# Patient Record
Sex: Male | Born: 1965 | Race: White | Hispanic: No | Smoking: Never smoker
Health system: Southern US, Community
[De-identification: ages and names within clinical notes are randomized; demographics above are authoritative.]

## PROBLEM LIST (undated history)

## (undated) DIAGNOSIS — M1990 Unspecified osteoarthritis, unspecified site: Secondary | ICD-10-CM

## (undated) DIAGNOSIS — F32A Depression, unspecified: Secondary | ICD-10-CM

## (undated) DIAGNOSIS — E119 Type 2 diabetes mellitus without complications: Secondary | ICD-10-CM

## (undated) DIAGNOSIS — G709 Myoneural disorder, unspecified: Secondary | ICD-10-CM

## (undated) DIAGNOSIS — R0600 Dyspnea, unspecified: Secondary | ICD-10-CM

## (undated) HISTORY — PX: LEG SURGERY: SHX1003

---

## 2018-12-18 ENCOUNTER — Other Ambulatory Visit: Payer: Self-pay

## 2018-12-18 ENCOUNTER — Encounter (HOSPITAL_BASED_OUTPATIENT_CLINIC_OR_DEPARTMENT_OTHER): Payer: Self-pay | Admitting: *Deleted

## 2018-12-18 ENCOUNTER — Emergency Department (HOSPITAL_BASED_OUTPATIENT_CLINIC_OR_DEPARTMENT_OTHER)
Admission: EM | Admit: 2018-12-18 | Discharge: 2018-12-18 | Disposition: A | Discharge status: Home / Self Care | Payer: Self-pay | Attending: Emergency Medicine | Admitting: Emergency Medicine

## 2018-12-18 DIAGNOSIS — L03116 Cellulitis of left lower limb: Secondary | ICD-10-CM | POA: Insufficient documentation

## 2018-12-18 DIAGNOSIS — L0390 Cellulitis, unspecified: Secondary | ICD-10-CM

## 2018-12-18 DIAGNOSIS — M25562 Pain in left knee: Secondary | ICD-10-CM

## 2018-12-18 MED ORDER — CLINDAMYCIN HCL 300 MG PO CAPS: 300.0000 mg | ORAL_CAPSULE | Freq: Three times a day (TID) | ORAL | 0 refills | Status: AC

## 2018-12-18 NOTE — ED Provider Notes (Signed)
[facility] Provider Note   CSN: [id number] Arrival date & time: 12/18/18  2205     History   Chief Complaint Chief Complaint  Patient presents with   Knee Pain    HPI Tommy Scott is a 53 y.o. male.     The history is provided by the patient.  Knee Pain Location:  Knee Time since incident:  2 days Knee location:  R knee and L knee Pain details:    Quality:  Aching   Radiates to:  Does not radiate   Severity:  Mild   Onset quality:  Gradual   Duration:  2 days   Timing:  Constant   Progression:  Worsening Chronicity:  New Relieved by:  Nothing Worsened by:  Bearing weight Associated symptoms: swelling   Associated symptoms: no back pain, no decreased ROM, no fatigue, no fever, no itching, no muscle weakness, no neck pain, no numbness, no stiffness and no tingling     History reviewed. No pertinent past medical history.  There are no active problems to display for this patient.   History reviewed. No pertinent surgical history.      Home Medications    Prior to Admission medications   Medication Sig Start Date End Date Taking? Authorizing Provider  clindamycin (CLEOCIN) 300 MG capsule Take 1 capsule (300 mg total) by mouth 3 (three) times daily for 10 days. 12/18/18 12/28/18  Lennice Sites, DO    Family History No family history on file.  Social History Social History   Tobacco Use   Smoking status: Never Smoker   Smokeless tobacco: Never Used  Substance Use Topics   Alcohol use: Not Currently   Drug use: Never     Allergies   Sulfamethoxazole-trimethoprim   Review of Systems Review of Systems  Constitutional: Negative for chills, fatigue and fever.  HENT: Negative for ear pain and sore throat.   Eyes: Negative for pain and visual disturbance.  Respiratory: Negative for cough and shortness of breath.   Cardiovascular: Negative for chest pain and palpitations.  Gastrointestinal: Negative for abdominal  pain and vomiting.  Genitourinary: Negative for dysuria and hematuria.  Musculoskeletal: Positive for arthralgias. Negative for back pain, neck pain and stiffness.  Skin: Positive for color change, rash and wound. Negative for itching.  Neurological: Negative for seizures and syncope.  All other systems reviewed and are negative.    Physical Exam Updated Vital Signs BP 106/66    Pulse 90    Temp 98.4 F (36.9 C) (Oral)    Resp 20    Ht 5\' 10"  (1.778 m)    Wt 83.9 kg    SpO2 98%    BMI 26.54 kg/m   Physical Exam Vitals signs and nursing note reviewed.  Constitutional:      General: He is not in acute distress.    Appearance: He is well-developed. He is not ill-appearing.  HENT:     Head: Normocephalic and atraumatic.     Nose: Nose normal.     Mouth/Throat:     Mouth: Mucous membranes are moist.  Eyes:     Extraocular Movements: Extraocular movements intact.     Conjunctiva/sclera: Conjunctivae normal.  Neck:     Musculoskeletal: Normal range of motion and neck supple.  Cardiovascular:     Rate and Rhythm: Normal rate and regular rhythm.     Pulses: Normal pulses.     Heart sounds: Normal heart sounds. No murmur.  Pulmonary:  Effort: Pulmonary effort is normal. No respiratory distress.     Breath sounds: Normal breath sounds.  Abdominal:     Palpations: Abdomen is soft.     Tenderness: There is no abdominal tenderness.  Musculoskeletal: Normal range of motion.        General: Swelling present. No tenderness.     Right lower leg: No edema.     Left lower leg: No edema.     Comments: Normal range of motion without pain to the left knee, there is some swelling to the medial portion of the left knee but  pinpoint tenderness  Skin:    General: Skin is warm and dry.     Comments: 2 scabs to the left knee with surrounding redness but no fluctuance  Neurological:     General: No focal deficit present.     Mental Status: He is alert.     Sensory: No sensory deficit.      Motor: No weakness.     Comments: 5+ out of 5 strength throughout left lower extremity, normal sensation      ED Treatments / Results  Labs (all labs ordered are listed, but only abnormal results are displayed) Labs Reviewed - No data to display  EKG None  Radiology No results found.  Procedures Procedures (including critical care time)  Medications Ordered in ED Medications - No data to display   Initial Impression / Assessment and Plan / ED Course  I have reviewed the triage vital signs and the nursing notes.  Pertinent labs & imaging results that were available during my care of the patient were reviewed by me and considered in my medical decision making (see chart for details).     Tommy Scott is a 53 year old male with history of diabetes who presents to the ED with redness around the left knee area.  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  He has 2 areas of skin breakdown around the inner part of the left knee with surrounding cellulitis.  There does not appear to be any joint involvement.  Does not have pain with range of motion of the left knee.  No obvious major joint swelling on exam.  Has had skin infections in the past especially in the lower portion of this leg.  However this cellulitis appears fairly localized.  This is likely from traumatic/pressure injury from being on his knees for work.  Given education about needing to change his position when he is on work.  He needs to not be on his knees for prolonged periods of time especially now that it looks like he has an infection.  Will prescribe clindamycin.  Given education about wound care instructions.  No concern for abscess or deeper space infection at this time.  However was given strict return precautions.  Recommend wound check at around 48 to 72 hours or sooner if redness rapidly expands.  However, this appears to have occurred over the last several days and is likely slowly progressing.   There may be some element of bursitis as well but at this point mostly think that this is cellulitic in nature.  Was given information to follow-up with sports medicine as well if symptoms do not improve as likely some component could be a traumatic bursitis.  Recommend follow-up with primary care doctor and patient understands return precautions.  This chart was dictated using voice recognition software.  Despite best efforts to proofread,  errors can occur which can  change the documentation meaning.    Final Clinical Impressions(s) / ED Diagnoses   Final diagnoses:  Cellulitis, unspecified cellulitis site  Acute pain of left knee    ED Discharge Orders         Ordered    clindamycin (CLEOCIN) 300 MG capsule  3 times daily     12/18/18 2253           Virgina Norfolk, DO 12/19/18 0002

## 2018-12-18 NOTE — ED Triage Notes (Signed)
Left knee pain. States his knee is red, hot, painful and swollen. He feels like he has fluid accumulated on his knee.

## 2018-12-18 NOTE — Discharge Instructions (Addendum)
Take antibiotic as prescribed.  If redness gets worse over the next 48 to 72 hours after antibiotics please return for wound recheck.  Ice your knee as discussed.  Keep elevated as well.  Please try to avoid any trauma to your left knee.

## 2019-06-05 ENCOUNTER — Other Ambulatory Visit: Payer: Self-pay

## 2019-06-05 ENCOUNTER — Encounter (HOSPITAL_BASED_OUTPATIENT_CLINIC_OR_DEPARTMENT_OTHER): Payer: Self-pay

## 2019-06-05 ENCOUNTER — Emergency Department (HOSPITAL_BASED_OUTPATIENT_CLINIC_OR_DEPARTMENT_OTHER)
Admission: EM | Admit: 2019-06-05 | Discharge: 2019-06-06 | Disposition: A | Discharge status: Home / Self Care | Payer: Self-pay | Attending: Emergency Medicine | Admitting: Emergency Medicine

## 2019-06-05 DIAGNOSIS — E119 Type 2 diabetes mellitus without complications: Secondary | ICD-10-CM | POA: Insufficient documentation

## 2019-06-05 DIAGNOSIS — Z23 Encounter for immunization: Secondary | ICD-10-CM | POA: Insufficient documentation

## 2019-06-05 DIAGNOSIS — L03115 Cellulitis of right lower limb: Secondary | ICD-10-CM

## 2019-06-05 DIAGNOSIS — Z882 Allergy status to sulfonamides status: Secondary | ICD-10-CM | POA: Insufficient documentation

## 2019-06-05 DIAGNOSIS — L02415 Cutaneous abscess of right lower limb: Secondary | ICD-10-CM | POA: Insufficient documentation

## 2019-06-05 HISTORY — DX: Type 2 diabetes mellitus without complications: E11.9

## 2019-06-05 MED ORDER — LIDOCAINE-EPINEPHRINE (PF) 2 %-1:200000 IJ SOLN
20.0000 mL | Freq: Once | INTRAMUSCULAR | Status: AC
  Administered 2019-06-06: 20 mL
  Filled 2019-06-05: qty 20

## 2019-06-05 NOTE — ED Notes (Signed)
Provider aware medications, I & D tray + betadine are at bedside.

## 2019-06-05 NOTE — ED Triage Notes (Signed)
Pt states he a break in the skin injury 1.5 weeks ago to right LE-pt with swelling, redness drainage x 1 week-NAD-steady gait

## 2019-06-05 NOTE — ED Provider Notes (Signed)
[facility] EM[facility]ENCY DEPARTMENT Provider Note  CSN: [id number] Arrival date & time: 06/05/19 2118  Chief Complaint(s) Leg Swelling  HPI Tommy Scott is a 54 y.o. male  With h/oDM here with right leg injury. Hit leg on tail hitch 1.5 weeks ago. Started having local swelling a few days later, which worsened several days ago. Pain throbbing and worsened. Worse with palpation. No alleviating factors.  Started draining tonight. Noted surrounding redness yesterday.  No fevers or chills, N/V. No other physical complaints.  HPI  Past Medical History Past Medical History:  Diagnosis Date  . Diabetes mellitus without complication (HCC)    There are no problems to display for this patient.  Home Medication(s) Prior to Admission medications   Medication Sig Start Date End Date Taking? Authorizing Provider  clindamycin (CLEOCIN) 150 MG capsule Take 3 capsules (450 mg total) by mouth 3 (three) times daily for 7 days. 06/06/19 06/13/19  Nira Conn, MD                                                                                                                                    Past Surgical History Past Surgical History:  Procedure Laterality Date  . LEG SURGERY     Family History No family history on file.  Social History Social History   Tobacco Use  . Smoking status: Never Smoker  . Smokeless tobacco: Never Used  Substance Use Topics  . Alcohol use: Not Currently  . Drug use: Never   Allergies Sulfa antibiotics and Sulfamethoxazole-trimethoprim  Review of Systems Review of Systems All other systems are reviewed and are negative for acute change except as noted in the HPI  Physical Exam Vital Signs  I have reviewed the triage vital signs BP 128/74 (BP Location: Left Arm)   Pulse 89   Temp 99.2 F (37.3 C) (Oral)   Resp 18   SpO2 99%   Physical Exam Vitals reviewed.  Constitutional:      General: He is not in acute distress.  Appearance: He is well-developed. He is not diaphoretic.  HENT:     Head: Normocephalic and atraumatic.     Jaw: No trismus.     Right Ear: External ear normal.     Left Ear: External ear normal.     Nose: Nose normal.  Eyes:     General: No scleral icterus.    Conjunctiva/sclera: Conjunctivae normal.  Neck:     Trachea: Phonation normal.  Cardiovascular:     Rate and Rhythm: Normal rate and regular rhythm.  Pulmonary:     Effort: Pulmonary effort is normal. No respiratory distress.     Breath sounds: No stridor.  Abdominal:     General: There is no distension.  Musculoskeletal:        General: Normal range of motion.     Cervical back: Normal range of motion.     Right lower leg:  Swelling and tenderness present.       Legs:  Neurological:     Mental Status: He is alert and oriented to person, place, and time.  Psychiatric:        Behavior: Behavior normal.     ED Results and Treatments Labs (all labs ordered are listed, but only abnormal results are displayed) Labs Reviewed - No data to display                                                                                                                       EKG  EKG Interpretation  Date/Time:    Ventricular Rate:    PR Interval:    QRS Duration:   QT Interval:    QTC Calculation:   R Axis:     Text Interpretation:        Radiology No results found.  Pertinent labs & imaging results that were available during my care of the patient were reviewed by me and considered in my medical decision making (see chart for details).  Medications Ordered in ED Medications  lidocaine-EPINEPHrine (XYLOCAINE W/EPI) 2 %-1:200000 (PF) injection 20 mL (20 mLs Infiltration Given 06/06/19 0056)  Tdap (BOOSTRIX) injection 0.5 mL (0.5 mLs Intramuscular Given 06/06/19 0056)  clindamycin (CLEOCIN) capsule 450 mg (450 mg Oral Given 06/06/19 0056)                                                                                                                                     Procedures .Marland KitchenIncision and Drainage  Date/Time: 06/06/2019 1:13 AM Performed by: Fatima Blank, MD Authorized by: Fatima Blank, MD   Consent:    Consent obtained:  Verbal   Consent given by:  Patient   Risks discussed:  Bleeding, incomplete drainage, infection and pain   Alternatives discussed:  Delayed treatment Location:    Type:  Abscess   Size:  4 cm   Location:  Lower extremity   Lower extremity location:  Leg   Leg location:  R lower leg Pre-procedure details:    Skin preparation:  Betadine Anesthesia (see MAR for exact dosages):    Anesthesia method:  Local infiltration   Local anesthetic:  Lidocaine 2% WITH epi Procedure type:    Complexity:  Complex Procedure details:    Incision types:  Cruciate   Incision depth:  Subcutaneous   Scalpel blade:  11   Wound management:  Probed and deloculated and extensive cleaning   Drainage:  Bloody and purulent   Drainage amount:  Moderate   Wound treatment:  Wound left open   Packing materials:  None Post-procedure details:    Patient tolerance of procedure:  Tolerated well, no immediate complications    (including critical care time)  Medical Decision Making / ED Course I have reviewed the nursing notes for this encounter and the patient's prior records (if available in EHR or on provided paperwork).   Tommy Scott was evaluated in Emergency Department on 06/06/2019 for the symptoms described in the history of present illness. He was evaluated in the context of the global COVID-19 pandemic, which necessitated consideration that the patient might be at risk for infection with the SARS-CoV-2 virus that causes COVID-19. Institutional protocols and algorithms that pertain to the evaluation of patients at risk for COVID-19 are in a state of rapid change based on information released by regulatory bodies including the CDC and federal and state organizations. These  policies and algorithms were followed during the patient's care in the ED.  No [place] available. Appears to be infected hematoma with surrounding cellulitis. I&D as above. Tetanus updated. 1st dose of clinda given here. Will have return in 2-3 days for wound check.     Final Clinical Impression(s) / ED Diagnoses Final diagnoses:  Abscess of right leg  Cellulitis of right lower extremity   The patient appears reasonably screened and/or stabilized for discharge and I doubt any other medical condition or other [facility] requiring further screening, evaluation, or treatment in the ED at this time prior to discharge. Safe for discharge with strict return precautions.  Disposition: Discharge  Condition: Good  I have discussed the results, Dx and Tx plan with the patient/family who expressed understanding and agree(s) with the plan. Discharge instructions discussed at length. The patient/family was given strict return precautions who verbalized understanding of the instructions. No further questions at time of discharge.    ED Discharge Orders         Ordered    clindamycin (CLEOCIN) 150 MG capsule  3 times daily     06/06/19 0123           Follow Up: [facility] [place] [facility] [address] [id number] mc [place] [address] [phone] Go to  in 2-3 days, For wound re-check      This chart was dictated using voice recognition software.  Despite best efforts to proofread,  errors can occur which can change the documentation meaning.   Nira Conn, MD 06/06/19 [phone]

## 2019-06-06 MED ORDER — TETANUS-DIPHTH-ACELL PERTUSSIS 5-2.5-18.5 LF-MCG/0.5 IM SUSP
0.5000 mL | Freq: Once | INTRAMUSCULAR | Status: AC
  Administered 2019-06-06: 0.5 mL via INTRAMUSCULAR
  Filled 2019-06-06: qty 0.5

## 2019-06-06 MED ORDER — CLINDAMYCIN HCL 150 MG PO CAPS
450.0000 mg | ORAL_CAPSULE | Freq: Once | ORAL | Status: AC
  Administered 2019-06-06: 01:00:00 450 mg via ORAL
  Filled 2019-06-06: qty 3

## 2019-06-06 MED ORDER — CLINDAMYCIN HCL 150 MG PO CAPS: 450.0000 mg | ORAL_CAPSULE | Freq: Three times a day (TID) | ORAL | 0 refills | Status: AC

## 2019-06-08 ENCOUNTER — Other Ambulatory Visit: Payer: Self-pay

## 2019-06-08 ENCOUNTER — Emergency Department (HOSPITAL_BASED_OUTPATIENT_CLINIC_OR_DEPARTMENT_OTHER): Payer: Self-pay

## 2019-06-08 ENCOUNTER — Emergency Department (HOSPITAL_BASED_OUTPATIENT_CLINIC_OR_DEPARTMENT_OTHER)
Admission: EM | Admit: 2019-06-08 | Discharge: 2019-06-08 | Disposition: A | Discharge status: Home / Self Care | Payer: Self-pay | Attending: Emergency Medicine | Admitting: Emergency Medicine

## 2019-06-08 ENCOUNTER — Encounter (HOSPITAL_BASED_OUTPATIENT_CLINIC_OR_DEPARTMENT_OTHER): Payer: Self-pay | Admitting: *Deleted

## 2019-06-08 DIAGNOSIS — L02415 Cutaneous abscess of right lower limb: Secondary | ICD-10-CM | POA: Insufficient documentation

## 2019-06-08 DIAGNOSIS — E119 Type 2 diabetes mellitus without complications: Secondary | ICD-10-CM | POA: Insufficient documentation

## 2019-06-08 DIAGNOSIS — Z5189 Encounter for other specified aftercare: Secondary | ICD-10-CM

## 2019-06-08 MED ORDER — LIDOCAINE-EPINEPHRINE (PF) 2 %-1:200000 IJ SOLN
10.0000 mL | Freq: Once | INTRAMUSCULAR | Status: AC
  Administered 2019-06-08: 10 mL
  Filled 2019-06-08: qty 10

## 2019-06-08 NOTE — ED Notes (Signed)
ED Provider at bedside. 

## 2019-06-08 NOTE — ED Triage Notes (Addendum)
Pt had I&D to right leg on Wed. Here for re-check. He states he has been taking clindamycin TID

## 2019-06-08 NOTE — ED Provider Notes (Signed)
[facility] Provider Note   CSN: [id number] Arrival date & time: 06/08/19  2149     History Chief Complaint  Patient presents with  . Wound Check    Tommy Scott is a 54 y.o. male presenting to ED for wound recheck.  He had an abscess drained overlying his right mid-tibia 4 days ago in our ER.  He was started on clindamycin and has been taking it, halfway through his 10 day course.  He reports the redness and swelling around his leg has significant improved.  He denies active drainage from the wound.  He returned for a wound check.  He denies fevers or chills.  HPI     Past Medical History:  Diagnosis Date  . Diabetes mellitus without complication (HCC)     There are no problems to display for this patient.   Past Surgical History:  Procedure Laterality Date  . LEG SURGERY         No family history on file.  Social History   Tobacco Use  . Smoking status: Never Smoker  . Smokeless tobacco: Never Used  Substance Use Topics  . Alcohol use: Not Currently  . Drug use: Never    Home Medications Prior to Admission medications   Medication Sig Start Date End Date Taking? Authorizing Provider  clindamycin (CLEOCIN) 150 MG capsule Take 3 capsules (450 mg total) by mouth 3 (three) times daily for 7 days. 06/06/19 06/13/19 Yes Cardama, Amadeo Garnet, MD    Allergies    Sulfa antibiotics and Sulfamethoxazole-trimethoprim  Review of Systems   Review of Systems  Constitutional: Negative for chills and fever.  Gastrointestinal: Negative for abdominal pain and vomiting.  Genitourinary: Negative for dysuria and hematuria.  Musculoskeletal: Positive for arthralgias and myalgias. Negative for back pain.  Skin: Positive for rash and wound.  Neurological: Negative for syncope.  All other systems reviewed and are negative.   Physical Exam Updated Vital Signs BP (!) 147/81 (BP Location: Right Arm)   Pulse 87   Temp 98.3 F (36.8 C) (Oral)    Resp 16   Ht 5\' 10"  (1.778 m)   Wt 90.3 kg   SpO2 96%   BMI 28.55 kg/m   Physical Exam Vitals and nursing note reviewed.  Constitutional:      Appearance: He is well-developed.  HENT:     Head: Normocephalic and atraumatic.  Cardiovascular:     Rate and Rhythm: Normal rate and regular rhythm.     Pulses: Normal pulses.  Pulmonary:     Effort: Pulmonary effort is normal. No respiratory distress.  Musculoskeletal:     Cervical back: Neck supple.  Skin:    General: Skin is warm and dry.     Comments: Right tibial wound site continues to have surrounding erythema without significant warmth, erythema appears to have improved from prior visit, no active drainage from wound, small hematoma overlying anterior tibia near incision site without fluctuance   Neurological:     General: No focal deficit present.     Mental Status: He is alert.     Sensory: No sensory deficit.         ED Results / Procedures / Treatments   Labs (all labs ordered are listed, but only abnormal results are displayed) Labs Reviewed - No data to display  EKG None  Radiology DG Tibia/Fibula Right  Result Date: 06/08/2019 CLINICAL DATA:  Abscess, assess for radiopaque foreign body EXAM: RIGHT TIBIA AND FIBULA - 2 VIEW  COMPARISON:  None. FINDINGS: Frontal and lateral views of the right tibia and fibula are obtained. There is prominent soft tissue edema anterior aspect mid right lower leg. Multiple calcifications are seen within the superficial soft tissues overlying the anterior tibia. No radiopaque foreign bodies. There are no acute or destructive bony lesions. Right knee and ankle are well aligned. IMPRESSION: 1. Soft tissue edema, most pronounced over the anterior mid tibial diaphysis. 2. Soft tissue calcifications.  No other radiopaque foreign bodies. Electronically Signed   By: Sharlet Salina M.D.   On: 06/08/2019 22:43    Procedures Ultrasound ED Soft Tissue  Date/Time: 06/08/2019 11:18  PM Performed by: Terald Sleeper, MD Authorized by: Terald Sleeper, MD   Procedure details:    Indications: localization of abscess     Transverse view:  Visualized   Longitudinal view:  Visualized   Images: archived   Location:    Location: lower extremity     Side:  Right Comments:     Small fluid collection visualized underneath incision site Surrounding soft tissue swelling Small circular hyperechoic structure visualized in middle of former drainage site .Marland KitchenIncision and Drainage  Date/Time: 06/08/2019 11:19 PM Performed by: Terald Sleeper, MD Authorized by: Terald Sleeper, MD   Consent:    Consent obtained:  Verbal   Consent given by:  Patient   Risks discussed:  Bleeding Location:    Type:  Abscess   Size:  2   Location:  Lower extremity   Lower extremity location:  Leg   Leg location:  R lower leg Pre-procedure details:    Skin preparation:  Antiseptic wash Anesthesia (see MAR for exact dosages):    Anesthesia method:  Local infiltration   Local anesthetic:  Lidocaine 2% WITH epi Procedure type:    Complexity:  Simple Procedure details:    Incision types:  Stab incision   Wound management:  Probed and deloculated   Drainage:  Serosanguinous   Drainage amount:  Scant   Wound treatment:  Wound left open   Packing materials:  None Post-procedure details:    Patient tolerance of procedure:  Tolerated well, no immediate complications   (including critical care time)  Medications Ordered in ED Medications  lidocaine-EPINEPHrine (XYLOCAINE W/EPI) 2 %-1:200000 (PF) injection 10 mL (10 mLs Infiltration Given 06/08/19 2310)    ED Course  I have reviewed the triage vital signs and the nursing notes.  Pertinent labs & imaging results that were available during my care of the patient were reviewed by me and considered in my medical decision making (see chart for details).  54 yo male presenting back for wound recheck after abscess I&D four days ago in our  ED.  It appears his swelling and erythema has significantly improved from his last visit.  No fevers.  Photos included above.  I performed bedside ultrasound which showed hyperechoic structure - on xray this appears to be soft tissue calcification, not retained foreign body  Ultrasound showed a possible pocket of fluid directly underneath incision site.  It wasn't clear if this was pus or serous fluid, so I reopened the wound minimally over the incision with a simple stab, and drained just scant serous fluid.  I doubt this is a purulent recollection.  I advised continuing his antibiotics, f/u with his PCP in 1-2 weeks.  Clinical Course as of Jun 07 2318  Sat Jun 08, 2019  2256 IMPRESSION: 1. Soft tissue edema, most pronounced over the anterior mid tibial diaphysis. 2.  Soft tissue calcifications. No other radiopaque foreign bodies.   [MT]    Clinical Course User Index [MT] Maytal Mijangos, Carola Rhine, MD     Final Clinical Impression(s) / ED Diagnoses Final diagnoses:  Visit for wound check  Wound check, abscess    Rx / DC Orders ED Discharge Orders    None       Wyvonnia Dusky, MD 06/08/19 2320

## 2019-06-08 NOTE — Discharge Instructions (Addendum)
Call your primary care doctor's office and ask for an appointment in the next 1-2 weeks to have your wound rechecked.  Your wound looks better today. The redness is improving.  There was very little pus left inside the wound.  You have some soft-tissue swelling there, which is to be expected.  You can put ice on the wound for 10 minutes at a time, 2-3 times per day if you need to.  Please continue taking your antibiotics until you finish the full course of 10 days.  They are working well.  Leave the dressing on your wound for the next 3-4 days.  Afterwards, if there is a scab formed over the hole, you can leave it open to air.  It may take another 2 weeks to close up completely.

## 2019-07-23 ENCOUNTER — Encounter (HOSPITAL_BASED_OUTPATIENT_CLINIC_OR_DEPARTMENT_OTHER): Payer: Self-pay | Admitting: *Deleted

## 2019-07-23 ENCOUNTER — Other Ambulatory Visit: Payer: Self-pay

## 2019-07-23 ENCOUNTER — Emergency Department (HOSPITAL_BASED_OUTPATIENT_CLINIC_OR_DEPARTMENT_OTHER): Payer: Self-pay

## 2019-07-23 ENCOUNTER — Emergency Department (HOSPITAL_BASED_OUTPATIENT_CLINIC_OR_DEPARTMENT_OTHER)
Admission: EM | Admit: 2019-07-23 | Discharge: 2019-07-23 | Disposition: A | Discharge status: Home / Self Care | Payer: Self-pay | Attending: Emergency Medicine | Admitting: Emergency Medicine

## 2019-07-23 DIAGNOSIS — X17XXXA Contact with hot engines, machinery and tools, initial encounter: Secondary | ICD-10-CM | POA: Insufficient documentation

## 2019-07-23 DIAGNOSIS — Z882 Allergy status to sulfonamides status: Secondary | ICD-10-CM | POA: Insufficient documentation

## 2019-07-23 DIAGNOSIS — E119 Type 2 diabetes mellitus without complications: Secondary | ICD-10-CM | POA: Insufficient documentation

## 2019-07-23 DIAGNOSIS — T23222A Burn of second degree of single left finger (nail) except thumb, initial encounter: Secondary | ICD-10-CM | POA: Insufficient documentation

## 2019-07-23 DIAGNOSIS — Y9389 Activity, other specified: Secondary | ICD-10-CM | POA: Insufficient documentation

## 2019-07-23 DIAGNOSIS — T23201A Burn of second degree of right hand, unspecified site, initial encounter: Secondary | ICD-10-CM

## 2019-07-23 DIAGNOSIS — Y929 Unspecified place or not applicable: Secondary | ICD-10-CM | POA: Insufficient documentation

## 2019-07-23 DIAGNOSIS — T23291A Burn of second degree of multiple sites of right wrist and hand, initial encounter: Secondary | ICD-10-CM | POA: Insufficient documentation

## 2019-07-23 DIAGNOSIS — Z794 Long term (current) use of insulin: Secondary | ICD-10-CM | POA: Insufficient documentation

## 2019-07-23 DIAGNOSIS — Y990 Civilian activity done for income or pay: Secondary | ICD-10-CM | POA: Insufficient documentation

## 2019-07-23 MED ORDER — CEFAZOLIN SODIUM-DEXTROSE 2-4 GM/100ML-% IV SOLN
2.0000 g | Freq: Once | INTRAVENOUS | Status: AC
  Administered 2019-07-23: 2 g via INTRAVENOUS
  Filled 2019-07-23: qty 100

## 2019-07-23 MED ORDER — CEPHALEXIN 500 MG PO CAPS: 500.0000 mg | ORAL_CAPSULE | Freq: Four times a day (QID) | ORAL | 0 refills | Status: AC

## 2019-07-23 MED ORDER — BACITRACIN ZINC 500 UNIT/GM EX OINT
TOPICAL_OINTMENT | Freq: Once | CUTANEOUS | Status: AC
  Administered 2019-07-23: 1 via TOPICAL
  Filled 2019-07-23 (×2): qty 28.35

## 2019-07-23 MED ORDER — TETANUS-DIPHTH-ACELL PERTUSSIS 5-2.5-18.5 LF-MCG/0.5 IM SUSP
0.5000 mL | Freq: Once | INTRAMUSCULAR | Status: DC
  Filled 2019-07-23: qty 0.5
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
## 2019-07-23 NOTE — Discharge Instructions (Addendum)
Recommend using bacitracin ointment, gauze as instructed for your hand wounds.  Please take antibiotic as prescribed.  Tomorrow morning, please call the hand clinic office to get close follow-up appointment.  Ideally you should be seen within the next day or two.  If you feel the swelling worsens, you develop fever, worsening redness, return to ER for recheck.
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
## 2020-04-04 ENCOUNTER — Other Ambulatory Visit: Payer: Self-pay

## 2020-04-04 ENCOUNTER — Encounter (HOSPITAL_BASED_OUTPATIENT_CLINIC_OR_DEPARTMENT_OTHER): Payer: Self-pay | Admitting: Emergency Medicine

## 2020-04-04 DIAGNOSIS — E1165 Type 2 diabetes mellitus with hyperglycemia: Secondary | ICD-10-CM | POA: Diagnosis not present

## 2020-04-04 DIAGNOSIS — U071 COVID-19: Secondary | ICD-10-CM | POA: Insufficient documentation

## 2020-04-04 DIAGNOSIS — Z794 Long term (current) use of insulin: Secondary | ICD-10-CM | POA: Diagnosis not present

## 2020-04-04 DIAGNOSIS — Z7984 Long term (current) use of oral hypoglycemic drugs: Secondary | ICD-10-CM | POA: Diagnosis not present

## 2020-04-04 DIAGNOSIS — R509 Fever, unspecified: Secondary | ICD-10-CM | POA: Diagnosis present

## 2020-04-04 LAB — CBG MONITORING, ED: Glucose-Capillary: 372 mg/dL — ABNORMAL HIGH (ref 70–99)

## 2020-04-04 NOTE — ED Triage Notes (Addendum)
Pt states he woke this morning with BIL leg pain and decided to sleep all day. He states he woke up with continued pain and states he could hardly walk. He states he feels his blood sugar must be low because he hasn't eaten today. CBG at triage 372.

## 2020-04-05 ENCOUNTER — Emergency Department (HOSPITAL_BASED_OUTPATIENT_CLINIC_OR_DEPARTMENT_OTHER)
Admission: EM | Admit: 2020-04-05 | Discharge: 2020-04-05 | Disposition: A | Discharge status: Home / Self Care | Payer: HRSA Program | Attending: Emergency Medicine | Admitting: Emergency Medicine

## 2020-04-05 DIAGNOSIS — M791 Myalgia, unspecified site: Secondary | ICD-10-CM

## 2020-04-05 DIAGNOSIS — U071 COVID-19: Secondary | ICD-10-CM

## 2020-04-05 DIAGNOSIS — R739 Hyperglycemia, unspecified: Secondary | ICD-10-CM

## 2020-04-05 LAB — BASIC METABOLIC PANEL
Anion gap: 11 (ref 5–15)
BUN: 30 mg/dL — ABNORMAL HIGH (ref 6–20)
CO2: 26 mmol/L (ref 22–32)
Calcium: 9.2 mg/dL (ref 8.9–10.3)
Chloride: 94 mmol/L — ABNORMAL LOW (ref 98–111)
Creatinine, Ser: 0.92 mg/dL (ref 0.61–1.24)
GFR, Estimated: >60 mL/min (ref >60)
Glucose, Bld: 403 mg/dL — ABNORMAL HIGH (ref 70–99)
Potassium: 5 mmol/L (ref 3.5–5.1)
Sodium: 131 mmol/L — ABNORMAL LOW (ref 135–145)

## 2020-04-05 LAB — CBC WITH DIFFERENTIAL/PLATELET
Abs Immature Granulocytes: 0.02 10*3/uL (ref 0.00–0.07)
Basophils Absolute: 0.1 10*3/uL (ref 0.0–0.1)
Basophils Relative: 1 %
Eosinophils Absolute: 0.1 10*3/uL (ref 0.0–0.5)
Eosinophils Relative: 1 %
HCT: 43.4 % (ref 39.0–52.0)
Hemoglobin: 14.9 g/dL (ref 13.0–17.0)
Immature Granulocytes: 0 %
Lymphocytes Relative: 6 %
Lymphs Abs: 0.7 10*3/uL (ref 0.7–4.0)
MCH: 29.9 pg (ref 26.0–34.0)
MCHC: 34.3 g/dL (ref 30.0–36.0)
MCV: 87.1 fL (ref 80.0–100.0)
Monocytes Absolute: 1.1 10*3/uL — ABNORMAL HIGH (ref 0.1–1.0)
Monocytes Relative: 10 %
Neutro Abs: 8.5 10*3/uL — ABNORMAL HIGH (ref 1.7–7.7)
Neutrophils Relative %: 82 %
Platelets: 185 10*3/uL (ref 150–400)
RBC: 4.98 MIL/uL (ref 4.22–5.81)
RDW: 11.7 % (ref 11.5–15.5)
WBC: 10.4 10*3/uL (ref 4.0–10.5)
nRBC: 0 % (ref 0.0–0.2)

## 2020-04-05 LAB — RESP PANEL BY RT-PCR (FLU A&B, COVID) ARPGX2
Influenza A by PCR: NEGATIVE
Influenza B by PCR: NEGATIVE
SARS Coronavirus 2 by RT PCR: POSITIVE — AB

## 2020-04-05 LAB — D-DIMER, QUANTITATIVE: D-Dimer, Quant: 0.31 ug/mL-FEU (ref 0.00–0.50)

## 2020-04-05 LAB — CK: Total CK: 83 U/L (ref 49–397)

## 2020-04-05 MED ORDER — SODIUM CHLORIDE 0.9 % IV BOLUS
1000.0000 mL | Freq: Once | INTRAVENOUS | Status: AC
  Administered 2020-04-05: 1000 mL via INTRAVENOUS

## 2020-04-05 NOTE — ED Notes (Signed)
EDP at bedside  

## 2020-04-05 NOTE — Discharge Instructions (Addendum)
You were seen today for leg pain.  This may be related to myalgias and COVID-19 infection.  Thankfully you are vaccinated.  Your blood sugar was noted to be elevated.  You were given fluids.  Make sure that you are drinking plenty of fluids at home.  Take Tylenol or Motrin for any body aches or pains.

## 2020-04-05 NOTE — ED Provider Notes (Signed)
[facility] EMERGENCY DEPARTMENT Provider Note   CSN: [id number] Arrival date & time: 04/04/20  2314     History Chief Complaint  Patient presents with   Generalized Body Aches    Tommy Scott is a 54 y.o. male.  HPI     This is a 54 year old male with a history of diabetes who presents with bilateral leg aching for lability.  Patient reports on Friday he noted tingling in aches in the bilateral legs.  He states he slept most of the day yesterday and was awoken by his family.  He did not note chills but was noted to have a fever upon arrival.  No known sick contacts or Covid exposures.  He is fully vaccinated without a booster.  Patient was concerned that his blood sugar was low as he has not eaten most of the day.  He denies shortness of breath, cough, chest pain, abdominal pain, nausea, vomiting.  Past Medical History:  Diagnosis Date   Diabetes mellitus without complication ([facility])     There are no problems to display for this patient.   Past Surgical History:  Procedure Laterality Date   LEG SURGERY         No family history on file.  Social History   Tobacco Use   Smoking status: Never Smoker   Smokeless tobacco: Never Used  Vaping Use   Vaping Use: Never used  Substance Use Topics   Alcohol use: Not Currently   Drug use: Never    Home Medications Prior to Admission medications   Medication Sig Start Date End Date Taking? Authorizing Provider  glipiZIDE (GLUCOTROL) 10 MG tablet Take by mouth. 10/11/16   [provider]  insulin glargine, 1 Unit Dial, (TOUJEO) 300 UNIT/ML Solostar Pen Inject into the skin. 11/07/17   [provider]  insulin NPH Human (NOVOLIN N) 100 UNIT/ML injection Inject into the skin. 11/01/17   [provider]  Insulin Syringe-Needle U-100 (BD VEO INSULIN SYR U/F 1/2UNIT) 31G X 15/64" 0.3 ML MISC Inject into the skin. 11/01/17   [provider]  lovastatin (MEVACOR) 20 MG tablet Take  by mouth. 10/11/16   [provider]  metFORMIN (GLUCOPHAGE-XR) 500 MG 24 hr tablet Take by mouth. 10/11/16   [provider]    Allergies    Sulfa antibiotics and Sulfamethoxazole-trimethoprim  Review of Systems   Review of Systems  Constitutional: Positive for fever. Negative for chills.  Respiratory: Negative for cough and shortness of breath.   Cardiovascular: Negative for chest pain.  Gastrointestinal: Negative for abdominal pain, nausea and vomiting.  Musculoskeletal:       Leg pain  Neurological: Negative for headaches.  All other systems reviewed and are negative.   Physical Exam Updated Vital Signs BP 123/70    Pulse 100    Temp 99.9 F (37.7 C) (Oral)    Resp 19    Ht 1.778 m (5\' 10" )    Wt 88 kg    SpO2 96%    BMI 27.84 kg/m   Physical Exam Vitals and nursing note reviewed.  Constitutional:      Appearance: He is well-developed and well-nourished. He is not ill-appearing.  HENT:     Head: Normocephalic and atraumatic.     Nose: Nose normal.     Mouth/Throat:     Mouth: Mucous membranes are dry.  Eyes:     Pupils: Pupils are equal, round, and reactive to light.  Cardiovascular:     Rate and  Rhythm: Normal rate and regular rhythm.     Heart sounds: Normal heart sounds. No murmur heard.   Pulmonary:     Effort: Pulmonary effort is normal. No respiratory distress.     Breath sounds: Normal breath sounds. No wheezing.  Abdominal:     General: Bowel sounds are normal.     Palpations: Abdomen is soft.     Tenderness: There is no abdominal tenderness. There is no rebound.  Musculoskeletal:        General: Tenderness present. No edema.     Cervical back: Neck supple.     Right lower leg: No edema.     Left lower leg: No edema.  Lymphadenopathy:     Cervical: No cervical adenopathy.  Skin:    General: Skin is warm and dry.  Neurological:     Mental Status: He is alert and oriented to person, place, and time.  Psychiatric:        Mood and  Affect: Mood and affect and mood normal.     ED Results / Procedures / Treatments   Labs (all labs ordered are listed, but only abnormal results are displayed) Labs Reviewed  RESP PANEL BY RT-PCR (FLU A&B, COVID) ARPGX2 - Abnormal; Notable for the following components:      Result Value   SARS Coronavirus 2 by RT PCR POSITIVE (*)    All other components within normal limits  CBC WITH DIFFERENTIAL/PLATELET - Abnormal; Notable for the following components:   Neutro Abs 8.5 (*)    Monocytes Absolute 1.1 (*)    All other components within normal limits  BASIC METABOLIC PANEL - Abnormal; Notable for the following components:   Sodium 131 (*)    Chloride 94 (*)    Glucose, Bld 403 (*)    BUN 30 (*)    All other components within normal limits  CBG MONITORING, ED - Abnormal; Notable for the following components:   Glucose-Capillary 372 (*)    All other components within normal limits  D-DIMER, QUANTITATIVE (NOT AT [facility])  CK    EKG None  Radiology No results found.  Procedures Procedures (including critical care time)  Medications Ordered in ED Medications  sodium chloride 0.9 % bolus 1,000 mL (1,000 mLs Intravenous New Bag/Given 04/05/20 0128)    ED Course  I have reviewed the triage vital signs and the nursing notes.  Pertinent labs & imaging results that were available during my care of the patient were reviewed by me and considered in my medical decision making (see chart for details).    MDM Rules/Calculators/A&P                          Patient presents with bilateral leg pain and tingling.  Also somnolent most of the day.  Noted to have a fever in triage 100.8.  He was unaware of this.  No known sick contacts.  Lab work from triage reviewed.  Blood glucose 372.  Covid testing is positive.  Patient denies any exposures.  He denies any cough or shortness of breath.  Given his hyperglycemia, will obtain some basic lab work.  We will also obtain a D-dimer to screen for  DVT given COVID-19 positive status and tenderness on exam.  There is no asymmetric swelling.  CK was also added given that he has not eaten in the last 12 hours and has leg pain.  BMP shows elevated glucose but no evidence of anion gap to  suggest DKA.  CK is normal.  D-dimer is negative.  Suspect he may be having myalgias given the bilateral nature of the pain and his positive Covid status.  Recommend ongoing supportive measures at home and hydration.  Patient stated understanding.  Do not feel he needs further work-up at this time.  After history, exam, and medical workup I feel the patient has been appropriately medically screened and is safe for discharge home. Pertinent diagnoses were discussed with the patient. Patient was given return precautions.  Final Clinical Impression(s) / ED Diagnoses Final diagnoses:  COVID-19  Hyperglycemia  Myalgia    Rx / DC Orders ED Discharge Orders    None       Rook Maue, Mayer Masker, MD 04/05/20 [phone]

## 2020-04-05 NOTE — ED Notes (Signed)
Dr. Horton aware of positive covid result.  

## 2020-11-10 ENCOUNTER — Emergency Department (HOSPITAL_BASED_OUTPATIENT_CLINIC_OR_DEPARTMENT_OTHER): Payer: Self-pay

## 2020-11-10 ENCOUNTER — Encounter (HOSPITAL_BASED_OUTPATIENT_CLINIC_OR_DEPARTMENT_OTHER): Payer: Self-pay | Admitting: *Deleted

## 2020-11-10 ENCOUNTER — Other Ambulatory Visit: Payer: Self-pay

## 2020-11-10 ENCOUNTER — Inpatient Hospital Stay (HOSPITAL_BASED_OUTPATIENT_CLINIC_OR_DEPARTMENT_OTHER)
Admission: EM | Admit: 2020-11-10 | Discharge: 2020-11-12 | DRG: 617 | Disposition: A | Discharge status: Home / Self Care | Payer: Self-pay | Attending: Family Medicine | Admitting: Family Medicine

## 2020-11-10 DIAGNOSIS — Z91120 Patient's intentional underdosing of medication regimen due to financial hardship: Secondary | ICD-10-CM

## 2020-11-10 DIAGNOSIS — L97529 Non-pressure chronic ulcer of other part of left foot with unspecified severity: Secondary | ICD-10-CM | POA: Diagnosis present

## 2020-11-10 DIAGNOSIS — L03032 Cellulitis of left toe: Secondary | ICD-10-CM | POA: Diagnosis present

## 2020-11-10 DIAGNOSIS — E1165 Type 2 diabetes mellitus with hyperglycemia: Secondary | ICD-10-CM | POA: Diagnosis present

## 2020-11-10 DIAGNOSIS — Z20822 Contact with and (suspected) exposure to covid-19: Secondary | ICD-10-CM | POA: Diagnosis present

## 2020-11-10 DIAGNOSIS — Z882 Allergy status to sulfonamides status: Secondary | ICD-10-CM

## 2020-11-10 DIAGNOSIS — Y92009 Unspecified place in unspecified non-institutional (private) residence as the place of occurrence of the external cause: Secondary | ICD-10-CM

## 2020-11-10 DIAGNOSIS — L089 Local infection of the skin and subcutaneous tissue, unspecified: Secondary | ICD-10-CM

## 2020-11-10 DIAGNOSIS — E1151 Type 2 diabetes mellitus with diabetic peripheral angiopathy without gangrene: Secondary | ICD-10-CM | POA: Diagnosis present

## 2020-11-10 DIAGNOSIS — E11621 Type 2 diabetes mellitus with foot ulcer: Secondary | ICD-10-CM | POA: Diagnosis present

## 2020-11-10 DIAGNOSIS — M86672 Other chronic osteomyelitis, left ankle and foot: Secondary | ICD-10-CM | POA: Diagnosis present

## 2020-11-10 DIAGNOSIS — E1142 Type 2 diabetes mellitus with diabetic polyneuropathy: Secondary | ICD-10-CM

## 2020-11-10 DIAGNOSIS — G5792 Unspecified mononeuropathy of left lower limb: Secondary | ICD-10-CM | POA: Diagnosis present

## 2020-11-10 DIAGNOSIS — E119 Type 2 diabetes mellitus without complications: Secondary | ICD-10-CM

## 2020-11-10 DIAGNOSIS — Z833 Family history of diabetes mellitus: Secondary | ICD-10-CM

## 2020-11-10 DIAGNOSIS — T50996A Underdosing of other drugs, medicaments and biological substances, initial encounter: Secondary | ICD-10-CM | POA: Diagnosis present

## 2020-11-10 DIAGNOSIS — E11628 Type 2 diabetes mellitus with other skin complications: Secondary | ICD-10-CM | POA: Diagnosis present

## 2020-11-10 DIAGNOSIS — E1169 Type 2 diabetes mellitus with other specified complication: Principal | ICD-10-CM | POA: Diagnosis present

## 2020-11-10 DIAGNOSIS — Z823 Family history of stroke: Secondary | ICD-10-CM

## 2020-11-10 DIAGNOSIS — E1141 Type 2 diabetes mellitus with diabetic mononeuropathy: Secondary | ICD-10-CM | POA: Diagnosis present

## 2020-11-10 DIAGNOSIS — M869 Osteomyelitis, unspecified: Secondary | ICD-10-CM

## 2020-11-10 DIAGNOSIS — Z597 Insufficient social insurance and welfare support: Secondary | ICD-10-CM

## 2020-11-10 LAB — GLUCOSE, CAPILLARY
Glucose-Capillary: 356 mg/dL — ABNORMAL HIGH (ref 70–99)
Glucose-Capillary: 222 mg/dL — ABNORMAL HIGH (ref 70–99)

## 2020-11-10 LAB — BASIC METABOLIC PANEL
Anion gap: 12 (ref 5–15)
BUN: 21 mg/dL — ABNORMAL HIGH (ref 6–20)
CO2: 27 mmol/L (ref 22–32)
Calcium: 9.4 mg/dL (ref 8.9–10.3)
Chloride: 91 mmol/L — ABNORMAL LOW (ref 98–111)
Creatinine, Ser: 0.73 mg/dL (ref 0.61–1.24)
GFR, Estimated: >60 mL/min (ref >60)
Glucose, Bld: 325 mg/dL — ABNORMAL HIGH (ref 70–99)
Potassium: 4.1 mmol/L (ref 3.5–5.1)
Sodium: 130 mmol/L — ABNORMAL LOW (ref 135–145)

## 2020-11-10 LAB — CBC WITH DIFFERENTIAL/PLATELET
Abs Immature Granulocytes: 0.04 10*3/uL (ref 0.00–0.07)
Basophils Absolute: 0.1 10*3/uL (ref 0.0–0.1)
Basophils Relative: 1 %
Eosinophils Absolute: 0.2 10*3/uL (ref 0.0–0.5)
Eosinophils Relative: 2 %
HCT: 38.4 % — ABNORMAL LOW (ref 39.0–52.0)
Hemoglobin: 13.3 g/dL (ref 13.0–17.0)
Immature Granulocytes: 0 %
Lymphocytes Relative: 15 %
Lymphs Abs: 1.8 10*3/uL (ref 0.7–4.0)
MCH: 29.1 pg (ref 26.0–34.0)
MCHC: 34.6 g/dL (ref 30.0–36.0)
MCV: 84 fL (ref 80.0–100.0)
Monocytes Absolute: 1 10*3/uL (ref 0.1–1.0)
Monocytes Relative: 8 %
Neutro Abs: 9.3 10*3/uL — ABNORMAL HIGH (ref 1.7–7.7)
Neutrophils Relative %: 74 %
Platelets: 237 10*3/uL (ref 150–400)
RBC: 4.57 MIL/uL (ref 4.22–5.81)
RDW: 11.8 % (ref 11.5–15.5)
WBC: 12.4 10*3/uL — ABNORMAL HIGH (ref 4.0–10.5)
nRBC: 0 % (ref 0.0–0.2)

## 2020-11-10 LAB — RESP PANEL BY RT-PCR (FLU A&B, COVID) ARPGX2
Influenza A by PCR: NEGATIVE
Influenza B by PCR: NEGATIVE
SARS Coronavirus 2 by RT PCR: NEGATIVE

## 2020-11-10 LAB — CBG MONITORING, ED: Glucose-Capillary: 302 mg/dL — ABNORMAL HIGH (ref 70–99)

## 2020-11-10 MED ORDER — VANCOMYCIN HCL IN DEXTROSE 1-5 GM/200ML-% IV SOLN
1000.0000 mg | Freq: Once | INTRAVENOUS | Status: AC
  Administered 2020-11-10: 1000 mg via INTRAVENOUS
  Filled 2020-11-10: qty 200

## 2020-11-10 MED ORDER — VANCOMYCIN HCL 1500 MG/300ML IV SOLN
1500.0000 mg | Freq: Two times a day (BID) | INTRAVENOUS | Status: DC
  Administered 2020-11-10 – 2020-11-12 (×4): 1500 mg via INTRAVENOUS
  Filled 2020-11-10 (×5): qty 300

## 2020-11-10 MED ORDER — VANCOMYCIN HCL IN DEXTROSE 1-5 GM/200ML-% IV SOLN: 1000.0000 mg | Freq: Once | INTRAVENOUS | Status: DC

## 2020-11-10 MED ORDER — ACETAMINOPHEN 650 MG RE SUPP: 650.0000 mg | Freq: Four times a day (QID) | RECTAL | Status: DC | PRN

## 2020-11-10 MED ORDER — SODIUM CHLORIDE 0.9 % IV SOLN
2.0000 g | Freq: Three times a day (TID) | INTRAVENOUS | Status: DC
  Administered 2020-11-10 – 2020-11-12 (×6): 2 g via INTRAVENOUS
  Filled 2020-11-10 (×6): qty 2

## 2020-11-10 MED ORDER — INSULIN ASPART 100 UNIT/ML IJ SOLN
0.0000 [IU] | Freq: Three times a day (TID) | INTRAMUSCULAR | Status: DC
  Administered 2020-11-10: 15 [IU] via SUBCUTANEOUS
  Administered 2020-11-11: 3 [IU] via SUBCUTANEOUS
  Administered 2020-11-11 (×2): 5 [IU] via SUBCUTANEOUS
  Administered 2020-11-12: 15 [IU] via SUBCUTANEOUS
  Administered 2020-11-12: 8 [IU] via SUBCUTANEOUS
  Filled 2020-11-10: qty 1

## 2020-11-10 MED ORDER — SODIUM CHLORIDE 0.9 % IV SOLN: INTRAVENOUS | Status: DC

## 2020-11-10 MED ORDER — ENOXAPARIN SODIUM 40 MG/0.4ML IJ SOSY
40.0000 mg | PREFILLED_SYRINGE | INTRAMUSCULAR | Status: DC
  Administered 2020-11-10: 40 mg via SUBCUTANEOUS
  Filled 2020-11-10: qty 0.4

## 2020-11-10 MED ORDER — SODIUM CHLORIDE 0.9% FLUSH
3.0000 mL | Freq: Two times a day (BID) | INTRAVENOUS | Status: DC
  Administered 2020-11-10 – 2020-11-11 (×2): 3 mL via INTRAVENOUS

## 2020-11-10 MED ORDER — ACETAMINOPHEN 325 MG PO TABS: 650.0000 mg | ORAL_TABLET | Freq: Four times a day (QID) | ORAL | Status: DC | PRN

## 2020-11-10 MED ORDER — PIPERACILLIN-TAZOBACTAM 3.375 G IVPB 30 MIN
3.3750 g | Freq: Once | INTRAVENOUS | Status: AC
  Administered 2020-11-10: 3.375 g via INTRAVENOUS
  Filled 2020-11-10: qty 50

## 2020-11-10 MED ORDER — INSULIN ASPART 100 UNIT/ML IJ SOLN
0.0000 [IU] | Freq: Every day | INTRAMUSCULAR | Status: DC
  Administered 2020-11-10 – 2020-11-11 (×2): 2 [IU] via SUBCUTANEOUS

## 2020-11-10 NOTE — ED Triage Notes (Signed)
C/o left leg /foot wound ? Infection x 3 weeks

## 2020-11-10 NOTE — Assessment & Plan Note (Addendum)
-  left great toe per xray with tuft erosion  - Likely due to poorly controlled diabetes and severe left lower extremity neuropathy -Given hyperglycemia on admission, unknown A1c, will cover for Pseudomonas at this time.  Start cefepime - Continue vancomycin per pharmacy - check ESR, CRP 

## 2020-11-10 NOTE — ED Notes (Signed)
Report given to [facility]. 

## 2020-11-10 NOTE — Hospital Course (Addendum)
Mr. Tommy Scott is a 55 yo male with PMH DMII who presented with an ongoing left great toe wound.  He states it started approximately 3 weeks ago.  He could not determine if he had injured or stepped on anything as he states he has no feeling in his left leg from his knee down which has been ongoing for approximately 6 years after having had leg surgery.  He is also unable to actively bend his left ankle but can do so passively with his hands. 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 His left toe is ulcerated with purulent drainage and associated edema down into the foot. He expresses concern with amputation when this is brought up as a possible need for definitive treatment. He endorsed having no insurance due to waiting on disability and has not been undergoing routine medical care but states that he has been getting refills on his glipizide for his diabetes.  He says he takes no other medications. He denies any fevers, chills, sweats on admission.  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 Lajoyce Corners consulted-performed MTP joint amputation 8/3

## 2020-11-10 NOTE — H&P (Signed)
History and Physical    Tommy Scott  ZSM:[id number]  DOB: 1965/08/20  DOA: 11/10/2020  PCP: Pcp, No Patient coming from: home  Chief Complaint: home  HPI:  Tommy Scott is a 55 yo male with PMH DMII who presented with an ongoing left great toe wound.  He states it started approximately 3 weeks ago.  He could not determine if he had injured or stepped on anything as he states he has no feeling in his left leg from his knee down which has been ongoing for approximately 6 years after having had leg surgery.  He is also unable to actively bend his left ankle but can do so passively with his hands. 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 His left toe is ulcerated with purulent drainage and associated edema down into the foot. He expresses concern with amputation when this is brought up as a possible need for definitive treatment. He endorsed having no insurance due to waiting on disability and has not been undergoing routine medical care but states that he has been getting refills on his glipizide for his diabetes.  He says he takes no other medications. He denies any fevers, chills, sweats on admission.  I have personally briefly reviewed patient's old medical records in [facility] and discussed patient with the ER provider when appropriate/indicated.  Assessment/Plan: * Osteomyelitis (HCC) - left great toe per xray with tuft erosion  - Likely due to poorly controlled diabetes and severe left lower extremity neuropathy -Given hyperglycemia on admission, unknown A1c, will cover for Pseudomonas at this time.  Start cefepime - Continue vancomycin per pharmacy - check ESR, CRP  DMII (diabetes mellitus, type 2) (HCC) - check A1c; hyperglycemic on admission which may also be due in part to active infection - continue SSI and CBG monitoring  -Depending on A1c, may  need basal insulin as well    Code Status:     Code Status: Full Code  DVT Prophylaxis:   enoxaparin (LOVENOX) injection 40 mg Start: 11/10/20 1545   Anticipated disposition is to: home  History: Past Medical History:  Diagnosis Date   Diabetes mellitus without complication ([place])     Past Surgical History:  Procedure Laterality Date   LEG SURGERY       reports that he has never smoked. He has never used smokeless tobacco. He reports previous alcohol use. He reports that he does not use drugs.  Allergies  Allergen Reactions   Sulfa Antibiotics Nausea Only and Other (See Comments)    Pt states that the medication caused chills, body aches & caused him to pee blood   Sulfamethoxazole Nausea Only and Other (See Comments)    Pt states that the medication caused chills, body aches & caused him to pee blood   Sulfamethoxazole-Trimethoprim Nausea Only and Other (See Comments)    Pt states that the medication caused chills, body aches & caused him to pee blood    Family History  Problem Relation Age of Onset   Stroke Mother    Diabetes Father    Home Medications: Prior to Admission medications   Medication Sig Start Date End Date Taking? Authorizing Provider  BAYER BACK & BODY PAIN EX ST 500-32.5 MG TABS Take 1 tablet by mouth every 6 (six) hours as needed (for pain).   Yes [provider]  Review of Systems:  Pertinent items noted in HPI and remainder of comprehensive ROS otherwise negative.  Physical Exam: Vitals:   11/10/20 0830 11/10/20 1149 11/10/20 1337 11/10/20 1655  BP: 122/71 (!) 154/90 (!) 142/84 118/67  Pulse: 86 90 88 94  Resp: 18 18 18 17   Temp:   99 F (37.2 C) 98.3 F (36.8 C)  TempSrc:   Oral Oral  SpO2: 97% 99% 95% 100%  Weight:      Height:       General appearance: alert, cooperative, and no distress Head: Normocephalic, without obvious abnormality, atraumatic Eyes:  EOMI Lungs: clear to auscultation bilaterally Heart: regular rate  and rhythm and S1, S2 normal Abdomen: normal findings: bowel sounds normal and soft, non-tender Extremities:  left great toe noted with purulent ulcerated lesion with foul smell and associated edema in left foot; there is a palpable DP pulse in left foot; left leg noted with chronic wound on anterior surface and vertical scar down leg; right leg has anterior shin wound as well Skin: mobility and turgor normal Neurologic: Grossly normal  Labs on Admission:  I have personally reviewed following labs and imaging studies Results for orders placed or performed during the hospital encounter of 11/10/20 (from the past 24 hour(s))  CBC with Differential/Platelet     Status: Abnormal   Collection Time: 11/10/20  4:08 AM  Result Value Ref Range   WBC 12.4 (H) 4.0 - 10.5 K/uL   RBC 4.57 4.22 - 5.81 MIL/uL   Hemoglobin 13.3 13.0 - 17.0 g/dL   HCT 38.4 (L) 39.0 - 52.0 %   MCV 84.0 80.0 - 100.0 fL   MCH 29.1 26.0 - 34.0 pg   MCHC 34.6 30.0 - 36.0 g/dL   RDW 11.8 11.5 - 15.5 %   Platelets 237 150 - 400 K/uL   nRBC 0.0 0.0 - 0.2 %   Neutrophils Relative % 74 %   Neutro Abs 9.3 (H) 1.7 - 7.7 K/uL   Lymphocytes Relative 15 %   Lymphs Abs 1.8 0.7 - 4.0 K/uL   Monocytes Relative 8 %   Monocytes Absolute 1.0 0.1 - 1.0 K/uL   Eosinophils Relative 2 %   Eosinophils Absolute 0.2 0.0 - 0.5 K/uL   Basophils Relative 1 %   Basophils Absolute 0.1 0.0 - 0.1 K/uL   Immature Granulocytes 0 %   Abs Immature Granulocytes 0.04 0.00 - 0.07 K/uL  Basic metabolic panel     Status: Abnormal   Collection Time: 11/10/20  4:08 AM  Result Value Ref Range   Sodium 130 (L) 135 - 145 mmol/L   Potassium 4.1 3.5 - 5.1 mmol/L   Chloride 91 (L) 98 - 111 mmol/L   CO2 27 22 - 32 mmol/L   Glucose, Bld 325 (H) 70 - 99 mg/dL   BUN 21 (H) 6 - 20 mg/dL   Creatinine, Ser 0.73 0.61 - 1.24 mg/dL   Calcium 9.4 8.9 - 10.3 mg/dL   GFR, Estimated >60 >60 mL/min   Anion gap 12 5 - 15  Resp Panel by RT-PCR (Flu A&B, Covid)  Nasopharyngeal Swab     Status: None   Collection Time: 11/10/20  4:08 AM   Specimen: Nasopharyngeal Swab; Nasopharyngeal(NP) swabs in vial transport medium  Result Value Ref Range   SARS Coronavirus 2 by RT PCR NEGATIVE NEGATIVE   Influenza A by PCR NEGATIVE NEGATIVE   Influenza B by PCR NEGATIVE NEGATIVE  CBG monitoring, ED     Status: Abnormal  Collection Time: 11/10/20  8:36 AM  Result Value Ref Range   Glucose-Capillary 302 (H) 70 - 99 mg/dL     Radiological Exams on Admission: DG Tibia/Fibula Left  Result Date: 11/10/2020 CLINICAL DATA:  Left leg and foot wounds. Possible infection 3 weeks ago. EXAM: LEFT TIBIA AND FIBULA - 2 VIEW COMPARISON:  06/08/2019 FINDINGS: Osteopenia and cortical thickening from venous stasis last interosseous insertional changes. Soft tissue swelling diffusely without soft tissue gas or cortical erosion. No acute fracture or subluxation. IMPRESSION: Nonspecific generalized soft tissue swelling. No fracture or erosion. Electronically Signed   By: Monte Fantasia M.D.   On: 11/10/2020 05:30   DG Foot Complete Left  Result Date: 11/10/2020 CLINICAL DATA:  Left leg and foot wounds. Possible infection for 3 weeks EXAM: LEFT FOOT - COMPLETE 3+ VIEW COMPARISON:  None. FINDINGS: Irregularity of the great toe nail/tip with erosion of the distal phalanx cortex at the tuft. No acute fracture or subluxation. First MTP osteoarthritis. IMPRESSION: Tuft erosion of the great toe consistent with osteomyelitis. Electronically Signed   By: Monte Fantasia M.D.   On: 11/10/2020 05:29   DG Foot Complete Left  Final Result    DG Tibia/Fibula Left  Final Result      Consults called:  Orthopedic surgery     Dwyane Dee, MD [facility] 11/10/2020, 5:14 PM

## 2020-11-10 NOTE — ED Provider Notes (Signed)
[facility] EMERGENCY DEPARTMENT Provider Note   CSN: [id number] Arrival date & time: 11/10/20  0007     History Chief Complaint  Patient presents with  . Wound Infection    Tommy Scott is a 55 y.o. male.  The history is provided by the patient.  Illness Location:  L great tow and shin Quality:  Wound on the great tow, scab on the left shin Severity:  Moderate Onset quality:  Gradual Duration:  3 weeks Timing:  Constant Progression:  Worsening Chronicity:  New Context:  Diabetic who is not taking medication, has not seen a primary doctor in over 2 years Relieved by:  Nothing Worsened by:  Time Ineffective treatments:  None Associated symptoms: no chest pain, no congestion, no diarrhea, no loss of consciousness, no nausea, no rhinorrhea, no shortness of breath, no vomiting and no wheezing   Risk factors:  DM, non compliance     Past Medical History:  Diagnosis Date  . Diabetes mellitus without complication (HCC)     There are no problems to display for this patient.   Past Surgical History:  Procedure Laterality Date  . LEG SURGERY         History reviewed. No pertinent family history.  Social History   Tobacco Use  . Smoking status: Never  . Smokeless tobacco: Never  Vaping Use  . Vaping Use: Never used  Substance Use Topics  . Alcohol use: Not Currently  . Drug use: Never    Home Medications Prior to Admission medications   Medication Sig Start Date End Date Taking? Authorizing Provider  glipiZIDE (GLUCOTROL) 10 MG tablet Take by mouth. 10/11/16   [provider]  insulin glargine, 1 Unit Dial, (TOUJEO) 300 UNIT/ML Solostar Pen Inject into the skin. 11/07/17   [provider]  insulin NPH Human (NOVOLIN N) 100 UNIT/ML injection Inject into the skin. 11/01/17   [provider]  Insulin Syringe-Needle U-100 (BD VEO INSULIN SYR U/F 1/2UNIT) 31G X 15/64" 0.3 ML MISC Inject into the skin. 11/01/17   [provider]  lovastatin (MEVACOR) 20 MG tablet Take by mouth. 10/11/16   [provider]  metFORMIN (GLUCOPHAGE-XR) 500 MG 24 hr tablet Take by mouth. 10/11/16   [provider]    Allergies    Sulfa antibiotics and Sulfamethoxazole-trimethoprim  Review of Systems   Review of Systems  Constitutional:  Negative for diaphoresis.  HENT:  Negative for congestion, facial swelling and rhinorrhea.   Eyes:  Negative for redness.  Respiratory:  Negative for shortness of breath and wheezing.   Cardiovascular:  Negative for chest pain.  Gastrointestinal:  Negative for diarrhea, nausea and vomiting.  Genitourinary:  Negative for difficulty urinating.  Musculoskeletal:  Positive for arthralgias.  Skin:  Positive for wound.  Neurological:  Negative for loss of consciousness and facial asymmetry.  Psychiatric/Behavioral:  Negative for agitation.   All other systems reviewed and are negative.  Physical Exam Updated Vital Signs BP (!) 153/84 (BP Location: Right Arm)   Pulse 95   Temp 98.4 F (36.9 C) (Oral)   Resp 16   Ht 5\' 10"  (1.778 m)   Wt 83.9 kg   SpO2 99%   BMI 26.54 kg/m   Physical Exam Vitals and nursing note reviewed.  Constitutional:      Appearance: Normal appearance. He is not diaphoretic.  HENT:     Head: Normocephalic and atraumatic.     Nose: Nose normal.  Eyes:     Conjunctiva/sclera:  Conjunctivae normal.     Pupils: Pupils are equal, round, and reactive to light.  Cardiovascular:     Rate and Rhythm: Normal rate and regular rhythm.     Pulses: Normal pulses.     Heart sounds: Normal heart sounds.  Pulmonary:     Effort: Pulmonary effort is normal.     Breath sounds: Normal breath sounds.  Abdominal:     General: Abdomen is flat. Bowel sounds are normal.     Palpations: Abdomen is soft.     Tenderness: There is no abdominal tenderness. There is no guarding.  Musculoskeletal:     Cervical back: Normal range of motion and neck supple.      Right lower leg: No edema.     Left lower leg: No edema.       Legs:       Feet:  Skin:    General: Skin is warm and dry.     Capillary Refill: Capillary refill takes less than 2 seconds.  Neurological:     General: No focal deficit present.     Mental Status: He is alert.     Deep Tendon Reflexes: Reflexes normal.  Psychiatric:        Mood and Affect: Mood normal.        Behavior: Behavior normal.    ED Results / Procedures / Treatments   Labs (all labs ordered are listed, but only abnormal results are displayed) Results for orders placed or performed during the hospital encounter of 11/10/20  CBC with Differential/Platelet  Result Value Ref Range   WBC 12.4 (H) 4.0 - 10.5 K/uL   RBC 4.57 4.22 - 5.81 MIL/uL   Hemoglobin 13.3 13.0 - 17.0 g/dL   HCT 84.6 (L) 65.9 - 93.5 %   MCV 84.0 80.0 - 100.0 fL   MCH 29.1 26.0 - 34.0 pg   MCHC 34.6 30.0 - 36.0 g/dL   RDW 70.1 77.9 - 39.0 %   Platelets 237 150 - 400 K/uL   nRBC 0.0 0.0 - 0.2 %   Neutrophils Relative % 74 %   Neutro Abs 9.3 (H) 1.7 - 7.7 K/uL   Lymphocytes Relative 15 %   Lymphs Abs 1.8 0.7 - 4.0 K/uL   Monocytes Relative 8 %   Monocytes Absolute 1.0 0.1 - 1.0 K/uL   Eosinophils Relative 2 %   Eosinophils Absolute 0.2 0.0 - 0.5 K/uL   Basophils Relative 1 %   Basophils Absolute 0.1 0.0 - 0.1 K/uL   Immature Granulocytes 0 %   Abs Immature Granulocytes 0.04 0.00 - 0.07 K/uL  Basic metabolic panel  Result Value Ref Range   Sodium 130 (L) 135 - 145 mmol/L   Potassium 4.1 3.5 - 5.1 mmol/L   Chloride 91 (L) 98 - 111 mmol/L   CO2 27 22 - 32 mmol/L   Glucose, Bld 325 (H) 70 - 99 mg/dL   BUN 21 (H) 6 - 20 mg/dL   Creatinine, Ser 3.00 0.61 - 1.24 mg/dL   Calcium 9.4 8.9 - 92.3 mg/dL   GFR, Estimated >30 >07 mL/min   Anion gap 12 5 - 15   No results found.  Radiology No results found.  Procedures Procedures   Medications Ordered in ED Medications  0.9 %  sodium chloride infusion (has no administration in  time range)  vancomycin (VANCOCIN) IVPB 1000 mg/200 mL premix (has no administration in time range)  piperacillin-tazobactam (ZOSYN) IVPB 3.375 g (has no administration in time range)  ED Course  I have reviewed the triage vital signs and the nursing notes.  Pertinent labs & imaging results that were available during my care of the patient were reviewed by me and considered in my medical decision making (see chart for details).   Trayvion Embleton was evaluated in Emergency Department on 11/10/2020 for the symptoms described in the history of present illness. He was evaluated in the context of the global COVID-19 pandemic, which necessitated consideration that the patient might be at risk for infection with the SARS-CoV-2 virus that causes COVID-19. Institutional protocols and algorithms that pertain to the evaluation of patients at risk for COVID-19 are in a state of rapid change based on information released by regulatory bodies including the CDC and federal and state organizations. These policies and algorithms were followed during the patient's care in the ED.  Final Clinical Impression(s) / ED Diagnoses Final diagnoses:  Wound infection  Type 2 diabetes mellitus with other specified complication, without long-term current use of insulin (HCC)   Admit to medicine  Rx / DC Orders ED Discharge Orders     None        Annais Crafts, MD 11/10/20 0370

## 2020-11-10 NOTE — H&P (View-Only) (Signed)
Reason for Consult:Left great toe osteo Referring Physician: Lewie Chamber Time called: 1427 Time at bedside: 1450   Tommy Scott is an 55 y.o. male.  HPI: Tommy Scott went to [facility] with a left great toe infection. He says it's been going on for about 3 weeks. He doesn't recall if he did anything to it. His foot is chronically insensate on that side so he's had no pain. His has DM but has not been treating it for about 2 years 2/2 lack of insurance and waiting on a disability determination.  Past Medical History:  Diagnosis Date   Diabetes mellitus without complication (HCC)     Past Surgical History:  Procedure Laterality Date   LEG SURGERY      History reviewed. No pertinent family history.  Social History:  reports that he has never smoked. He has never used smokeless tobacco. He reports previous alcohol use. He reports that he does not use drugs.  Allergies:  Allergies  Allergen Reactions   Sulfa Antibiotics    Sulfamethoxazole     Other reaction(s): GI Upset (intolerance)   Sulfamethoxazole-Trimethoprim Other (See Comments)    Pt states that the medication caused chills, body aches & caused him to pee blood.    Medications: I have reviewed the patient's current medications.  Results for orders placed or performed during the hospital encounter of 11/10/20 (from the past 48 hour(s))  CBC with Differential/Platelet     Status: Abnormal   Collection Time: 11/10/20  4:08 AM  Result Value Ref Range   WBC 12.4 (H) 4.0 - 10.5 K/uL   RBC 4.57 4.22 - 5.81 MIL/uL   Hemoglobin 13.3 13.0 - 17.0 g/dL   HCT 45.6 (L) 25.6 - 38.9 %   MCV 84.0 80.0 - 100.0 fL   MCH 29.1 26.0 - 34.0 pg   MCHC 34.6 30.0 - 36.0 g/dL   RDW 37.3 42.8 - 76.8 %   Platelets 237 150 - 400 K/uL   nRBC 0.0 0.0 - 0.2 %   Neutrophils Relative % 74 %   Neutro Abs 9.3 (H) 1.7 - 7.7 K/uL   Lymphocytes Relative 15 %   Lymphs Abs 1.8 0.7 - 4.0 K/uL   Monocytes Relative 8 %   Monocytes Absolute 1.0 0.1 - 1.0 K/uL    Eosinophils Relative 2 %   Eosinophils Absolute 0.2 0.0 - 0.5 K/uL   Basophils Relative 1 %   Basophils Absolute 0.1 0.0 - 0.1 K/uL   Immature Granulocytes 0 %   Abs Immature Granulocytes 0.04 0.00 - 0.07 K/uL    Comment: Performed at [facility], [address] [place], [place] [address]  Basic metabolic panel     Status: Abnormal   Collection Time: 11/10/20  4:08 AM  Result Value Ref Range   Sodium 130 (L) 135 - 145 mmol/L   Potassium 4.1 3.5 - 5.1 mmol/L   Chloride 91 (L) 98 - 111 mmol/L   CO2 27 22 - 32 mmol/L   Glucose, Bld 325 (H) 70 - 99 mg/dL    Comment: Glucose reference range applies only to samples taken after fasting for at least 8 hours.   BUN 21 (H) 6 - 20 mg/dL   Creatinine, Ser 6.20 0.61 - 1.24 mg/dL   Calcium 9.4 8.9 - 35.5 mg/dL   GFR, Estimated >97 >41 mL/min    Comment: (NOTE) Calculated using the CKD-EPI Creatinine Equation (2021)    Anion gap 12 5 - 15    Comment:  Performed at [facility], [address] [place], [place] [address]  Resp Panel by RT-PCR (Flu A&B, Covid) Nasopharyngeal Swab     Status: None   Collection Time: 11/10/20  4:08 AM   Specimen: Nasopharyngeal Swab; Nasopharyngeal(NP) swabs in vial transport medium  Result Value Ref Range   SARS Coronavirus 2 by RT PCR NEGATIVE NEGATIVE    Comment: (NOTE) SARS-CoV-2 target nucleic acids are NOT DETECTED.  The SARS-CoV-2 RNA is generally detectable in upper respiratory specimens during the acute phase of infection. The lowest concentration of SARS-CoV-2 viral copies this assay can detect is 138 copies/mL. A negative result does not preclude SARS-Cov-2 infection and should not be used as the sole basis for treatment or other patient management decisions. A negative result may occur with  improper specimen collection/handling, submission of specimen other than nasopharyngeal swab, presence of viral mutation(s) within the areas targeted by this assay, and inadequate  number of viral copies(<138 copies/mL). A negative result must be combined with clinical observations, patient history, and epidemiological information. The expected result is Negative.  Fact Sheet for Patients:  BloggerCourse.com  Fact Sheet for Healthcare Providers:  SeriousBroker.it  This test is no t yet approved or cleared by the [place] FDA and  has been authorized for detection and/or diagnosis of SARS-CoV-2 by FDA under an Emergency Use Authorization (EUA). This EUA will remain  in effect (meaning this test can be used) for the duration of the COVID-19 declaration under Section 564(b)(1) of the Act, 21 U.S.C.section 360bbb-3(b)(1), unless the authorization is terminated  or revoked sooner.       Influenza A by PCR NEGATIVE NEGATIVE   Influenza B by PCR NEGATIVE NEGATIVE    Comment: (NOTE) The Xpert Xpress SARS-CoV-2/FLU/RSV plus assay is intended as an aid in the diagnosis of influenza from Nasopharyngeal swab specimens and should not be used as a sole basis for treatment. Nasal washings and aspirates are unacceptable for Xpert Xpress SARS-CoV-2/FLU/RSV testing.  Fact Sheet for Patients: BloggerCourse.com  Fact Sheet for Healthcare Providers: SeriousBroker.it  This test is not yet approved or cleared by the [place] FDA and has been authorized for detection and/or diagnosis of SARS-CoV-2 by FDA under an Emergency Use Authorization (EUA). This EUA will remain in effect (meaning this test can be used) for the duration of the COVID-19 declaration under Section 564(b)(1) of the Act, 21 U.S.C. section 360bbb-3(b)(1), unless the authorization is terminated or revoked.  Performed at [facility], [address] [place], [place] [address]   CBG monitoring, ED     Status: Abnormal   Collection Time: 11/10/20  8:36 AM  Result Value Ref Range    Glucose-Capillary 302 (H) 70 - 99 mg/dL    Comment: Glucose reference range applies only to samples taken after fasting for at least 8 hours.    DG Tibia/Fibula Left  Result Date: 11/10/2020 CLINICAL DATA:  Left leg and foot wounds. Possible infection 3 weeks ago. EXAM: LEFT TIBIA AND FIBULA - 2 VIEW COMPARISON:  06/08/2019 FINDINGS: Osteopenia and cortical thickening from venous stasis last interosseous insertional changes. Soft tissue swelling diffusely without soft tissue gas or cortical erosion. No acute fracture or subluxation. IMPRESSION: Nonspecific generalized soft tissue swelling. No fracture or erosion. Electronically Signed   By: Marnee Spring M.D.   On: 11/10/2020 05:30   DG Foot Complete Left  Result Date: 11/10/2020 CLINICAL DATA:  Left leg and foot wounds. Possible infection for 3 weeks EXAM: LEFT  FOOT - COMPLETE 3+ VIEW COMPARISON:  None. FINDINGS: Irregularity of the great toe nail/tip with erosion of the distal phalanx cortex at the tuft. No acute fracture or subluxation. First MTP osteoarthritis. IMPRESSION: Tuft erosion of the great toe consistent with osteomyelitis. Electronically Signed   By: Marnee Spring M.D.   On: 11/10/2020 05:29    Review of Systems  Constitutional:  Negative for chills, diaphoresis and fever.  HENT:  Negative for ear discharge, ear pain, hearing loss and tinnitus.   Eyes:  Negative for photophobia and pain.  Respiratory:  Negative for cough and shortness of breath.   Cardiovascular:  Negative for chest pain.  Gastrointestinal:  Negative for abdominal pain, nausea and vomiting.  Genitourinary:  Negative for dysuria, flank pain, frequency and urgency.  Musculoskeletal:  Positive for joint swelling (Left great toe). Negative for back pain, myalgias and neck pain.  Neurological:  Negative for dizziness and headaches.  Hematological:  Does not bruise/bleed easily.  Psychiatric/Behavioral:  The patient is not nervous/anxious.   Blood pressure (!)  142/84, pulse 88, temperature 99 F (37.2 C), temperature source Oral, resp. rate 18, height 5\' 10"  (1.778 m), weight 83.9 kg, SpO2 95 %. Physical Exam Constitutional:      General: He is not in acute distress.    Appearance: He is well-developed. He is not diaphoretic.  HENT:     Head: Normocephalic and atraumatic.  Eyes:     General: No scleral icterus.       Right eye: No discharge.        Left eye: No discharge.     Conjunctiva/sclera: Conjunctivae normal.  Cardiovascular:     Rate and Rhythm: Normal rate and regular rhythm.  Pulmonary:     Effort: Pulmonary effort is normal. No respiratory distress.  Musculoskeletal:     Cervical back: Normal range of motion.  Feet:     Comments: Left foot: Fusiform edema of great toe with pad ulceration and purulent discharge. Insensate. 2+ DP/PT. Unable to AROM ankle. No sig pedal edema outside of great toe. Skin:    General: Skin is warm and dry.  Neurological:     Mental Status: He is alert.  Psychiatric:        Mood and Affect: Mood normal.        Behavior: Behavior normal.    Assessment/Plan: Left great toe osteo -- Will likely need amputation. Dr. to evaluate later today or in AM.    Tommy Corners, PA-C Orthopedic Surgery [phone] 11/10/2020, 3:03 PM

## 2020-11-10 NOTE — Plan of Care (Signed)
[facility] will assume care on arrival to accepting facility. Until arrival, care as per EDP. However, [facility] available 24/7 for questions and assistance.  Nursing staff please page [facility] Admits and Consults ([phone]) as soon as the patient arrives the hospital.   

## 2020-11-10 NOTE — Progress Notes (Signed)
Pharmacy Antibiotic Note  Tommy Scott is a 55 y.o. male admitted on 11/10/2020 with ongoing wound infection.  Pharmacy has been consulted for Vancomycin / Cefepime dosing.  Plan: Cefepime 2 grams iv Q 8 hours Vancomycin 1500 mg iv q 12 hours  (AUC of 535 using Scr of 0.8)  Follow up cultures, Scr, LOT  Height: 5\' 10"  (177.8 cm) Weight: 83.9 kg (185 lb) IBW/kg (Calculated) : 73  Temp (24hrs), Avg:98.9 F (37.2 C), Min:98.4 F (36.9 C), Max:99.2 F (37.3 C)  Recent Labs  Lab 11/10/20 0408  WBC 12.4*  CREATININE 0.73    Estimated Creatinine Clearance: 106.5 mL/min (by C-G formula based on SCr of 0.73 mg/dL).    Allergies  Allergen Reactions   Sulfa Antibiotics    Sulfamethoxazole     Other reaction(s): GI Upset (intolerance)   Sulfamethoxazole-Trimethoprim Other (See Comments)    Pt states that the medication caused chills, body aches & caused him to pee blood.      Thank you 01/10/21, PharmD  11/10/2020 2:59 PM

## 2020-11-10 NOTE — Consult Note (Signed)
Reason for Consult:Left great toe osteo Referring Physician: Lewie Chamber Time called: 1427 Time at bedside: 1450   Tommy Scott is an 55 y.o. male.  HPI: [name] went to [facility] with a left great toe infection. He says it's been going on for about 3 weeks. He doesn't recall if he did anything to it. His foot is chronically insensate on that side so he's had no pain. His has DM but has not been treating it for about 2 years 2/2 lack of insurance and waiting on a disability determination.  Past Medical History:  Diagnosis Date   Diabetes mellitus without complication (HCC)     Past Surgical History:  Procedure Laterality Date   LEG SURGERY      History reviewed. No pertinent family history.  Social History:  reports that he has never smoked. He has never used smokeless tobacco. He reports previous alcohol use. He reports that he does not use drugs.  Allergies:  Allergies  Allergen Reactions   Sulfa Antibiotics    Sulfamethoxazole     Other reaction(s): GI Upset (intolerance)   Sulfamethoxazole-Trimethoprim Other (See Comments)    Pt states that the medication caused chills, body aches & caused him to pee blood.    Medications: I have reviewed the patient's current medications.  Results for orders placed or performed during the hospital encounter of 11/10/20 (from the past 48 hour(s))  CBC with Differential/Platelet     Status: Abnormal   Collection Time: 11/10/20  4:08 AM  Result Value Ref Range   WBC 12.4 (H) 4.0 - 10.5 K/uL   RBC 4.57 4.22 - 5.81 MIL/uL   Hemoglobin 13.3 13.0 - 17.0 g/dL   HCT 45.6 (L) 25.6 - 38.9 %   MCV 84.0 80.0 - 100.0 fL   MCH 29.1 26.0 - 34.0 pg   MCHC 34.6 30.0 - 36.0 g/dL   RDW 37.3 42.8 - 76.8 %   Platelets 237 150 - 400 K/uL   nRBC 0.0 0.0 - 0.2 %   Neutrophils Relative % 74 %   Neutro Abs 9.3 (H) 1.7 - 7.7 K/uL   Lymphocytes Relative 15 %   Lymphs Abs 1.8 0.7 - 4.0 K/uL   Monocytes Relative 8 %   Monocytes Absolute 1.0 0.1 - 1.0 K/uL    Eosinophils Relative 2 %   Eosinophils Absolute 0.2 0.0 - 0.5 K/uL   Basophils Relative 1 %   Basophils Absolute 0.1 0.0 - 0.1 K/uL   Immature Granulocytes 0 %   Abs Immature Granulocytes 0.04 0.00 - 0.07 K/uL    Comment: Performed at [facility], [address] [place], [place] [address]  Basic metabolic panel     Status: Abnormal   Collection Time: 11/10/20  4:08 AM  Result Value Ref Range   Sodium 130 (L) 135 - 145 mmol/L   Potassium 4.1 3.5 - 5.1 mmol/L   Chloride 91 (L) 98 - 111 mmol/L   CO2 27 22 - 32 mmol/L   Glucose, Bld 325 (H) 70 - 99 mg/dL    Comment: Glucose reference range applies only to samples taken after fasting for at least 8 hours.   BUN 21 (H) 6 - 20 mg/dL   Creatinine, Ser 6.20 0.61 - 1.24 mg/dL   Calcium 9.4 8.9 - 35.5 mg/dL   GFR, Estimated >97 >41 mL/min    Comment: (NOTE) Calculated using the CKD-EPI Creatinine Equation (2021)    Anion gap 12 5 - 15    Comment:  Performed at [facility], [address] [place], [place] [address]  Resp Panel by RT-PCR (Flu A&B, Covid) Nasopharyngeal Swab     Status: None   Collection Time: 11/10/20  4:08 AM   Specimen: Nasopharyngeal Swab; Nasopharyngeal(NP) swabs in vial transport medium  Result Value Ref Range   SARS Coronavirus 2 by RT PCR NEGATIVE NEGATIVE    Comment: (NOTE) SARS-CoV-2 target nucleic acids are NOT DETECTED.  The SARS-CoV-2 RNA is generally detectable in upper respiratory specimens during the acute phase of infection. The lowest concentration of SARS-CoV-2 viral copies this assay can detect is 138 copies/mL. A negative result does not preclude SARS-Cov-2 infection and should not be used as the sole basis for treatment or other patient management decisions. A negative result may occur with  improper specimen collection/handling, submission of specimen other than nasopharyngeal swab, presence of viral mutation(s) within the areas targeted by this assay, and inadequate  number of viral copies(<138 copies/mL). A negative result must be combined with clinical observations, patient history, and epidemiological information. The expected result is Negative.  Fact Sheet for Patients:  BloggerCourse.com  Fact Sheet for Healthcare Providers:  SeriousBroker.it  This test is no t yet approved or cleared by the [place] FDA and  has been authorized for detection and/or diagnosis of SARS-CoV-2 by FDA under an Emergency Use Authorization (EUA). This EUA will remain  in effect (meaning this test can be used) for the duration of the COVID-19 declaration under Section 564(b)(1) of the Act, 21 U.S.C.section 360bbb-3(b)(1), unless the authorization is terminated  or revoked sooner.       Influenza A by PCR NEGATIVE NEGATIVE   Influenza B by PCR NEGATIVE NEGATIVE    Comment: (NOTE) The Xpert Xpress SARS-CoV-2/FLU/RSV plus assay is intended as an aid in the diagnosis of influenza from Nasopharyngeal swab specimens and should not be used as a sole basis for treatment. Nasal washings and aspirates are unacceptable for Xpert Xpress SARS-CoV-2/FLU/RSV testing.  Fact Sheet for Patients: BloggerCourse.com  Fact Sheet for Healthcare Providers: SeriousBroker.it  This test is not yet approved or cleared by the [place] FDA and has been authorized for detection and/or diagnosis of SARS-CoV-2 by FDA under an Emergency Use Authorization (EUA). This EUA will remain in effect (meaning this test can be used) for the duration of the COVID-19 declaration under Section 564(b)(1) of the Act, 21 U.S.C. section 360bbb-3(b)(1), unless the authorization is terminated or revoked.  Performed at [facility], [address] [place], [place] [address]   CBG monitoring, ED     Status: Abnormal   Collection Time: 11/10/20  8:36 AM  Result Value Ref Range    Glucose-Capillary 302 (H) 70 - 99 mg/dL    Comment: Glucose reference range applies only to samples taken after fasting for at least 8 hours.    DG Tibia/Fibula Left  Result Date: 11/10/2020 CLINICAL DATA:  Left leg and foot wounds. Possible infection 3 weeks ago. EXAM: LEFT TIBIA AND FIBULA - 2 VIEW COMPARISON:  06/08/2019 FINDINGS: Osteopenia and cortical thickening from venous stasis last interosseous insertional changes. Soft tissue swelling diffusely without soft tissue gas or cortical erosion. No acute fracture or subluxation. IMPRESSION: Nonspecific generalized soft tissue swelling. No fracture or erosion. Electronically Signed   By: Marnee Spring M.D.   On: 11/10/2020 05:30   DG Foot Complete Left  Result Date: 11/10/2020 CLINICAL DATA:  Left leg and foot wounds. Possible infection for 3 weeks EXAM: LEFT  FOOT - COMPLETE 3+ VIEW COMPARISON:  None. FINDINGS: Irregularity of the great toe nail/tip with erosion of the distal phalanx cortex at the tuft. No acute fracture or subluxation. First MTP osteoarthritis. IMPRESSION: Tuft erosion of the great toe consistent with osteomyelitis. Electronically Signed   By: Marnee Spring M.D.   On: 11/10/2020 05:29    Review of Systems  Constitutional:  Negative for chills, diaphoresis and fever.  HENT:  Negative for ear discharge, ear pain, hearing loss and tinnitus.   Eyes:  Negative for photophobia and pain.  Respiratory:  Negative for cough and shortness of breath.   Cardiovascular:  Negative for chest pain.  Gastrointestinal:  Negative for abdominal pain, nausea and vomiting.  Genitourinary:  Negative for dysuria, flank pain, frequency and urgency.  Musculoskeletal:  Positive for joint swelling (Left great toe). Negative for back pain, myalgias and neck pain.  Neurological:  Negative for dizziness and headaches.  Hematological:  Does not bruise/bleed easily.  Psychiatric/Behavioral:  The patient is not nervous/anxious.   Blood pressure (!)  142/84, pulse 88, temperature 99 F (37.2 C), temperature source Oral, resp. rate 18, height 5\' 10"  (1.778 m), weight 83.9 kg, SpO2 95 %. Physical Exam Constitutional:      General: He is not in acute distress.    Appearance: He is well-developed. He is not diaphoretic.  HENT:     Head: Normocephalic and atraumatic.  Eyes:     General: No scleral icterus.       Right eye: No discharge.        Left eye: No discharge.     Conjunctiva/sclera: Conjunctivae normal.  Cardiovascular:     Rate and Rhythm: Normal rate and regular rhythm.  Pulmonary:     Effort: Pulmonary effort is normal. No respiratory distress.  Musculoskeletal:     Cervical back: Normal range of motion.  Feet:     Comments: Left foot: Fusiform edema of great toe with pad ulceration and purulent discharge. Insensate. 2+ DP/PT. Unable to AROM ankle. No sig pedal edema outside of great toe. Skin:    General: Skin is warm and dry.  Neurological:     Mental Status: He is alert.  Psychiatric:        Mood and Affect: Mood normal.        Behavior: Behavior normal.    Assessment/Plan: Left great toe osteo -- Will likely need amputation. Dr. to evaluate later today or in AM.    Tommy Corners, PA-C Orthopedic Surgery [phone] 11/10/2020, 3:03 PM

## 2020-11-10 NOTE — Assessment & Plan Note (Addendum)
-   check A1c; hyperglycemic on admission which may also be due in part to active infection - continue SSI and CBG monitoring  -Depending on A1c, may need basal insulin as well

## 2020-11-11 ENCOUNTER — Encounter (HOSPITAL_COMMUNITY): Payer: Self-pay | Admitting: Internal Medicine

## 2020-11-11 ENCOUNTER — Other Ambulatory Visit: Payer: Self-pay | Admitting: Physician Assistant

## 2020-11-11 ENCOUNTER — Encounter (HOSPITAL_COMMUNITY)
Admission: EM | Disposition: A | Discharge status: Home / Self Care | Payer: Self-pay | Source: Home / Self Care | Attending: Family Medicine

## 2020-11-11 ENCOUNTER — Inpatient Hospital Stay (HOSPITAL_COMMUNITY): Payer: Self-pay | Admitting: Anesthesiology

## 2020-11-11 DIAGNOSIS — E1169 Type 2 diabetes mellitus with other specified complication: Principal | ICD-10-CM

## 2020-11-11 HISTORY — PX: AMPUTATION: SHX166

## 2020-11-11 LAB — CBC WITH DIFFERENTIAL/PLATELET
Abs Immature Granulocytes: 0.03 10*3/uL (ref 0.00–0.07)
Basophils Absolute: 0.1 10*3/uL (ref 0.0–0.1)
Basophils Relative: 1 %
Eosinophils Absolute: 0.3 10*3/uL (ref 0.0–0.5)
Eosinophils Relative: 3 %
HCT: 39.2 % (ref 39.0–52.0)
Hemoglobin: 13.1 g/dL (ref 13.0–17.0)
Immature Granulocytes: 0 %
Lymphocytes Relative: 18 %
Lymphs Abs: 1.8 10*3/uL (ref 0.7–4.0)
MCH: 28.5 pg (ref 26.0–34.0)
MCHC: 33.4 g/dL (ref 30.0–36.0)
MCV: 85.4 fL (ref 80.0–100.0)
Monocytes Absolute: 0.8 10*3/uL (ref 0.1–1.0)
Monocytes Relative: 8 %
Neutro Abs: 7 10*3/uL (ref 1.7–7.7)
Neutrophils Relative %: 70 %
Platelets: 242 10*3/uL (ref 150–400)
RBC: 4.59 MIL/uL (ref 4.22–5.81)
RDW: 11.7 % (ref 11.5–15.5)
WBC: 9.9 10*3/uL (ref 4.0–10.5)
nRBC: 0 % (ref 0.0–0.2)

## 2020-11-11 LAB — BASIC METABOLIC PANEL
Anion gap: 11 (ref 5–15)
BUN: 16 mg/dL (ref 6–20)
CO2: 26 mmol/L (ref 22–32)
Calcium: 9.4 mg/dL (ref 8.9–10.3)
Chloride: 95 mmol/L — ABNORMAL LOW (ref 98–111)
Creatinine, Ser: 0.7 mg/dL (ref 0.61–1.24)
GFR, Estimated: >60 mL/min (ref >60)
Glucose, Bld: 189 mg/dL — ABNORMAL HIGH (ref 70–99)
Potassium: 3.9 mmol/L (ref 3.5–5.1)
Sodium: 132 mmol/L — ABNORMAL LOW (ref 135–145)

## 2020-11-11 LAB — HEMOGLOBIN A1C
Hgb A1c MFr Bld: 12.6 % — ABNORMAL HIGH (ref 4.8–5.6)
Mean Plasma Glucose: 314.92 mg/dL

## 2020-11-11 LAB — GLUCOSE, CAPILLARY
Glucose-Capillary: 203 mg/dL — ABNORMAL HIGH (ref 70–99)
Glucose-Capillary: 226 mg/dL — ABNORMAL HIGH (ref 70–99)
Glucose-Capillary: 212 mg/dL — ABNORMAL HIGH (ref 70–99)
Glucose-Capillary: 197 mg/dL — ABNORMAL HIGH (ref 70–99)
Glucose-Capillary: 173 mg/dL — ABNORMAL HIGH (ref 70–99)
Glucose-Capillary: 222 mg/dL — ABNORMAL HIGH (ref 70–99)

## 2020-11-11 LAB — HIV ANTIBODY (ROUTINE TESTING W REFLEX): HIV Screen 4th Generation wRfx: NONREACTIVE

## 2020-11-11 LAB — SURGICAL PCR SCREEN
MRSA, PCR: POSITIVE — AB
Staphylococcus aureus: POSITIVE — AB

## 2020-11-11 LAB — MAGNESIUM: Magnesium: 2 mg/dL (ref 1.7–2.4)

## 2020-11-11 LAB — C-REACTIVE PROTEIN: CRP: 8.9 mg/dL — ABNORMAL HIGH (ref <1.0)

## 2020-11-11 LAB — SEDIMENTATION RATE: Sed Rate: 37 mm/hr — ABNORMAL HIGH (ref 0–16)

## 2020-11-11 SURGERY — AMPUTATION DIGIT
Anesthesia: Monitor Anesthesia Care | Laterality: Left

## 2020-11-11 MED ORDER — ACETAMINOPHEN 500 MG PO TABS
1000.0000 mg | ORAL_TABLET | Freq: Once | ORAL | Status: AC
  Administered 2020-11-11: 1000 mg via ORAL
  Filled 2020-11-11: qty 2

## 2020-11-11 MED ORDER — POLYETHYLENE GLYCOL 3350 17 G PO PACK: 17.0000 g | PACK | Freq: Every day | ORAL | Status: DC | PRN

## 2020-11-11 MED ORDER — LACTATED RINGERS IV SOLN: INTRAVENOUS | Status: DC | PRN

## 2020-11-11 MED ORDER — CHLORHEXIDINE GLUCONATE CLOTH 2 % EX PADS
6.0000 | MEDICATED_PAD | Freq: Every day | CUTANEOUS | Status: DC
  Administered 2020-11-12: 6 via TOPICAL

## 2020-11-11 MED ORDER — FENTANYL CITRATE (PF) 250 MCG/5ML IJ SOLN
INTRAMUSCULAR | Status: AC
  Filled 2020-11-11: qty 5

## 2020-11-11 MED ORDER — FENTANYL CITRATE (PF) 100 MCG/2ML IJ SOLN: 25.0000 ug | INTRAMUSCULAR | Status: DC | PRN

## 2020-11-11 MED ORDER — ORAL CARE MOUTH RINSE: 15.0000 mL | Freq: Once | OROMUCOSAL | Status: AC

## 2020-11-11 MED ORDER — HYDROMORPHONE HCL 1 MG/ML IJ SOLN: 0.5000 mg | INTRAMUSCULAR | Status: DC | PRN

## 2020-11-11 MED ORDER — PANTOPRAZOLE SODIUM 40 MG PO TBEC
40.0000 mg | DELAYED_RELEASE_TABLET | Freq: Every day | ORAL | Status: DC
  Administered 2020-11-11: 40 mg via ORAL
  Filled 2020-11-11 (×2): qty 1

## 2020-11-11 MED ORDER — OXYCODONE HCL 5 MG PO TABS: 5.0000 mg | ORAL_TABLET | ORAL | Status: DC | PRN

## 2020-11-11 MED ORDER — MUPIROCIN 2 % EX OINT
TOPICAL_OINTMENT | CUTANEOUS | Status: AC
  Filled 2020-11-11: qty 22

## 2020-11-11 MED ORDER — MIDAZOLAM HCL 5 MG/5ML IJ SOLN
INTRAMUSCULAR | Status: DC | PRN
  Administered 2020-11-11: 2 mg via INTRAVENOUS

## 2020-11-11 MED ORDER — PHENOL 1.4 % MT LIQD: 1.0000 | OROMUCOSAL | Status: DC | PRN

## 2020-11-11 MED ORDER — ASCORBIC ACID 500 MG PO TABS
1000.0000 mg | ORAL_TABLET | Freq: Every day | ORAL | Status: DC
  Administered 2020-11-11 – 2020-11-12 (×2): 1000 mg via ORAL
  Filled 2020-11-11 (×2): qty 2

## 2020-11-11 MED ORDER — CHLORHEXIDINE GLUCONATE 0.12 % MT SOLN: 15.0000 mL | Freq: Once | OROMUCOSAL | Status: AC

## 2020-11-11 MED ORDER — CHLORHEXIDINE GLUCONATE 0.12 % MT SOLN
OROMUCOSAL | Status: AC
  Administered 2020-11-11: 15 mL via OROMUCOSAL
  Filled 2020-11-11: qty 15

## 2020-11-11 MED ORDER — LACTATED RINGERS IV SOLN: INTRAVENOUS | Status: DC

## 2020-11-11 MED ORDER — DOCUSATE SODIUM 100 MG PO CAPS
100.0000 mg | ORAL_CAPSULE | Freq: Every day | ORAL | Status: DC
  Filled 2020-11-11: qty 1

## 2020-11-11 MED ORDER — ENOXAPARIN SODIUM 40 MG/0.4ML IJ SOSY: 40.0000 mg | PREFILLED_SYRINGE | INTRAMUSCULAR | Status: DC

## 2020-11-11 MED ORDER — ENSURE PRE-SURGERY PO LIQD
296.0000 mL | Freq: Once | ORAL | Status: DC
  Filled 2020-11-11: qty 296

## 2020-11-11 MED ORDER — MIDAZOLAM HCL 2 MG/2ML IJ SOLN
INTRAMUSCULAR | Status: AC
  Filled 2020-11-11: qty 2

## 2020-11-11 MED ORDER — LIDOCAINE HCL (PF) 1 % IJ SOLN
INTRAMUSCULAR | Status: DC | PRN
  Administered 2020-11-11: 18 mL

## 2020-11-11 MED ORDER — BISACODYL 5 MG PO TBEC: 5.0000 mg | DELAYED_RELEASE_TABLET | Freq: Every day | ORAL | Status: DC | PRN

## 2020-11-11 MED ORDER — POTASSIUM CHLORIDE CRYS ER 20 MEQ PO TBCR: 20.0000 meq | EXTENDED_RELEASE_TABLET | Freq: Every day | ORAL | Status: DC | PRN

## 2020-11-11 MED ORDER — OXYCODONE HCL 5 MG PO TABS: 5.0000 mg | ORAL_TABLET | Freq: Once | ORAL | Status: DC | PRN

## 2020-11-11 MED ORDER — INSULIN ASPART 100 UNIT/ML IJ SOLN: 3.0000 [IU] | Freq: Once | INTRAMUSCULAR | Status: AC

## 2020-11-11 MED ORDER — ZINC SULFATE 220 (50 ZN) MG PO CAPS
220.0000 mg | ORAL_CAPSULE | Freq: Every day | ORAL | Status: DC
Start: 2020-11-11 — End: 2020-11-12
  Administered 2020-11-11: 220 mg via ORAL
  Filled 2020-11-11 (×2): qty 1

## 2020-11-11 MED ORDER — PROMETHAZINE HCL 25 MG/ML IJ SOLN: 6.2500 mg | INTRAMUSCULAR | Status: DC | PRN

## 2020-11-11 MED ORDER — ALUM & MAG HYDROXIDE-SIMETH 200-200-20 MG/5ML PO SUSP: 15.0000 mL | ORAL | Status: DC | PRN

## 2020-11-11 MED ORDER — PROPOFOL 10 MG/ML IV BOLUS
INTRAVENOUS | Status: DC | PRN
  Administered 2020-11-11: 10 mg via INTRAVENOUS
  Administered 2020-11-11: 30 mg via INTRAVENOUS

## 2020-11-11 MED ORDER — GUAIFENESIN-DM 100-10 MG/5ML PO SYRP: 15.0000 mL | ORAL_SOLUTION | ORAL | Status: DC | PRN

## 2020-11-11 MED ORDER — ONDANSETRON HCL 4 MG/2ML IJ SOLN
INTRAMUSCULAR | Status: DC | PRN
  Administered 2020-11-11: 4 mg via INTRAVENOUS

## 2020-11-11 MED ORDER — FENTANYL CITRATE (PF) 100 MCG/2ML IJ SOLN
INTRAMUSCULAR | Status: DC | PRN
  Administered 2020-11-11: 25 ug via INTRAVENOUS

## 2020-11-11 MED ORDER — ONDANSETRON HCL 4 MG/2ML IJ SOLN
4.0000 mg | Freq: Four times a day (QID) | INTRAMUSCULAR | Status: DC | PRN
Start: 2020-11-11 — End: 2020-11-12

## 2020-11-11 MED ORDER — LIDOCAINE HCL (PF) 1 % IJ SOLN
INTRAMUSCULAR | Status: AC
  Filled 2020-11-11: qty 30

## 2020-11-11 MED ORDER — SODIUM CHLORIDE 0.9 % IV SOLN: INTRAVENOUS | Status: DC

## 2020-11-11 MED ORDER — JUVEN PO PACK
1.0000 | PACK | Freq: Two times a day (BID) | ORAL | Status: DC
  Administered 2020-11-12: 1 via ORAL
  Filled 2020-11-11: qty 1

## 2020-11-11 MED ORDER — MUPIROCIN 2 % EX OINT
1.0000 "application " | TOPICAL_OINTMENT | Freq: Two times a day (BID) | CUTANEOUS | Status: DC
  Administered 2020-11-11 – 2020-11-12 (×3): 1 via NASAL
  Filled 2020-11-11: qty 22

## 2020-11-11 MED ORDER — 0.9 % SODIUM CHLORIDE (POUR BTL) OPTIME
TOPICAL | Status: DC | PRN
  Administered 2020-11-11: 1000 mL

## 2020-11-11 MED ORDER — OXYCODONE HCL 5 MG/5ML PO SOLN: 5.0000 mg | Freq: Once | ORAL | Status: DC | PRN

## 2020-11-11 MED ORDER — INSULIN ASPART 100 UNIT/ML IJ SOLN
INTRAMUSCULAR | Status: AC
  Administered 2020-11-11: 3 [IU] via SUBCUTANEOUS
  Filled 2020-11-11: qty 1

## 2020-11-11 SURGICAL SUPPLY — 29 items
BAG COUNTER SPONGE SURGICOUNT (BAG) ×2 IMPLANT
BLADE SURG 21 STRL SS (BLADE) ×2 IMPLANT
BNDG COHESIVE 4X5 TAN STRL (GAUZE/BANDAGES/DRESSINGS) ×2 IMPLANT
BNDG COHESIVE 6X5 TAN NS LF (GAUZE/BANDAGES/DRESSINGS) ×1 IMPLANT
BNDG ESMARK 4X9 LF (GAUZE/BANDAGES/DRESSINGS) IMPLANT
BNDG GAUZE ELAST 4 BULKY (GAUZE/BANDAGES/DRESSINGS) ×2 IMPLANT
COVER SURGICAL LIGHT HANDLE (MISCELLANEOUS) ×4 IMPLANT
DRAPE U-SHAPE 47X51 STRL (DRAPES) ×2 IMPLANT
DRSG ADAPTIC 3X8 NADH LF (GAUZE/BANDAGES/DRESSINGS) ×1 IMPLANT
DRSG PAD ABDOMINAL 8X10 ST (GAUZE/BANDAGES/DRESSINGS) ×1 IMPLANT
DURAPREP 26ML APPLICATOR (WOUND CARE) ×2 IMPLANT
ELECT REM PT RETURN 9FT ADLT (ELECTROSURGICAL) ×2
ELECTRODE REM PT RTRN 9FT ADLT (ELECTROSURGICAL) ×1 IMPLANT
GAUZE SPONGE 4X4 12PLY STRL (GAUZE/BANDAGES/DRESSINGS) ×1 IMPLANT
GLOVE SURG ORTHO LTX SZ9 (GLOVE) ×2 IMPLANT
GLOVE SURG UNDER POLY LF SZ9 (GLOVE) ×2 IMPLANT
GOWN STRL REUS W/ TWL XL LVL3 (GOWN DISPOSABLE) ×2 IMPLANT
GOWN STRL REUS W/TWL XL LVL3 (GOWN DISPOSABLE) ×2
KIT BASIN OR (CUSTOM PROCEDURE TRAY) ×2 IMPLANT
KIT TURNOVER KIT B (KITS) ×2 IMPLANT
MANIFOLD NEPTUNE II (INSTRUMENTS) ×2 IMPLANT
NEEDLE 22X1 1/2 (OR ONLY) (NEEDLE) IMPLANT
NS IRRIG 1000ML POUR BTL (IV SOLUTION) ×2 IMPLANT
PACK ORTHO EXTREMITY (CUSTOM PROCEDURE TRAY) ×2 IMPLANT
PAD ABD 8X10 STRL (GAUZE/BANDAGES/DRESSINGS) ×1 IMPLANT
PAD ARMBOARD 7.5X6 YLW CONV (MISCELLANEOUS) ×4 IMPLANT
SUT ETHILON 2 0 PSLX (SUTURE) ×2 IMPLANT
SYR CONTROL 10ML LL (SYRINGE) IMPLANT
TOWEL GREEN STERILE (TOWEL DISPOSABLE) ×2 IMPLANT

## 2020-11-11 NOTE — Anesthesia Preprocedure Evaluation (Addendum)
Anesthesia Evaluation  Patient identified by MRN, date of birth, ID band Patient awake    Reviewed: Allergy & Precautions, NPO status , Patient's Chart, lab work & pertinent test results  History of Anesthesia Complications Negative for: history of anesthetic complications  Airway Mallampati: II  TM Distance: >3 FB Neck ROM: Full    Dental  (+) Poor Dentition, Missing, Chipped, Dental Advisory Given,    Pulmonary neg pulmonary ROS,    Pulmonary exam normal breath sounds clear to auscultation       Cardiovascular negative cardio ROS Normal cardiovascular exam Rhythm:Regular Rate:Normal     Neuro/Psych negative neurological ROS  negative psych ROS   GI/Hepatic negative GI ROS, Neg liver ROS,   Endo/Other  diabetes, Poorly Controlled, Type 2, Oral Hypoglycemic Agentsa1c 12.6, FS 226 13:00  Renal/GU negative Renal ROS  negative genitourinary   Musculoskeletal Osteomyelitis left great toe   Abdominal   Peds  Hematology negative hematology ROS (+)   Anesthesia Other Findings Day of surgery medications reviewed with patient.  Reproductive/Obstetrics negative OB ROS                           Anesthesia Physical Anesthesia Plan  ASA: 3  Anesthesia Plan: MAC   Post-op Pain Management:    Induction:   PONV Risk Score and Plan: 1 and Treatment may vary due to age or medical condition, Midazolam, Ondansetron and Propofol infusion  Airway Management Planned: Natural Airway and Simple Face Mask  Additional Equipment: None  Intra-op Plan:   Post-operative Plan:   Informed Consent: I have reviewed the patients History and Physical, chart, labs and discussed the procedure including the risks, benefits and alternatives for the proposed anesthesia with the patient or authorized representative who has indicated his/her understanding and acceptance.       Plan Discussed with:  CRNA  Anesthesia Plan Comments:        Anesthesia Quick Evaluation

## 2020-11-11 NOTE — Transfer of Care (Signed)
Immediate Anesthesia Transfer of Care Note  Patient: Tommy Scott  Procedure(s) Performed: AMPUTATION OF GREAT TOE (Left)  Patient Location: PACU  Anesthesia Type:MAC  Level of Consciousness: awake, alert  and oriented  Airway & Oxygen Therapy: Patient Spontanous Breathing  Post-op Assessment: Report given to RN and Post -op Vital signs reviewed and stable  Post vital signs: Reviewed and stable  Last Vitals:  Vitals Value Taken Time  BP 103/67 11/11/20 1445  Temp    Pulse 80 11/11/20 1447  Resp 16 11/11/20 1447  SpO2 98 % 11/11/20 1447  Vitals shown include unvalidated device data.  Last Pain:  Vitals:   11/11/20 1253  TempSrc: Oral  PainSc: 0-No pain      Patients Stated Pain Goal: 3 (83/38/25 0539)  Complications: No notable events documented.

## 2020-11-11 NOTE — Anesthesia Procedure Notes (Signed)
Procedure Name: MAC Date/Time: 11/11/2020 2:33 PM Performed by: Dorthea Cove, CRNA Pre-anesthesia Checklist: Patient identified, Emergency Drugs available, Suction available, Patient being monitored and Timeout performed Patient Re-evaluated:Patient Re-evaluated prior to induction Oxygen Delivery Method: Simple face mask Preoxygenation: Pre-oxygenation with 100% oxygen Induction Type: IV induction Placement Confirmation: positive ETCO2 and CO2 detector Dental Injury: Teeth and Oropharynx as per pre-operative assessment

## 2020-11-11 NOTE — Progress Notes (Signed)
Inpatient Diabetes Program Recommendations  AACE/ADA: New Consensus Statement on Inpatient Glycemic Control (2015)  Target Ranges:  Prepandial:   less than 140 mg/dL      Peak postprandial:   less than 180 mg/dL (1-2 hours)      Critically ill patients:  140 - 180 mg/dL   Lab Results  Component Value Date   GLUCAP 173 (H) 11/11/2020   HGBA1C 12.6 (H) 11/11/2020    Review of Glycemic Control  Diabetes history: DM2 Outpatient Diabetes medications: glipizide 10 mg QD Current orders for Inpatient glycemic control: Novolog 0-15 units TID with meals and 0-5 HS.  HgbA1C - 12.6% CBGs  today: 203, 226, 197, 173 mg/dL  Inpatient Diabetes Program Recommendations:    Consider adding 70/30 insulin - 10 units BID  Spoke with pt at bedside about his diabetes control and HgbA1C of 12.6% (average blood sugar 315 mg/dL). Pt states he has not been to PCP "in a long time." Has meter at home to monitor blood sugars. States blood sugars usually run 180-200s. Has been higher recently with his toe infection. Will likely need insulin for home. Discussed how diet, exercise, stress and infection affect blood sugar control. Stressed importance of f/u with PCP to manage his diabetes. Consult TOC for PCP and medication assistance. Will need affordable insulin. F/U with pt in am for insulin pen teaching if pt is to go home on insulin.  Thank you. Ailene Ards, RD, LDN, CDE Inpatient Diabetes Coordinator [phone]

## 2020-11-11 NOTE — Progress Notes (Signed)
Orthopedic Tech Progress Note Patient Details:  Tommy Scott [birth year] [id number]  Ortho Devices Type of Ortho Device: Postop shoe/boot Ortho Device/Splint Location: Left Foot Ortho Device/Splint Interventions: Application   Post Interventions Patient Tolerated: Well Instructions Provided: Adjustment of device  Yichen Gilardi E Dwana Garin 11/11/2020, 3:43 PM

## 2020-11-11 NOTE — Progress Notes (Signed)
[facility]  PROGRESS NOTE  Tommy Scott WUJ:[id number] DOB: Jun 16, 1965 DOA: 11/10/2020 PCP: Pcp, No   Brief HPI:   55 year old male with medical history of diabetes mellitus type 2 who presented with ongoing left great toe wound.  Patient says that he has no feeling in left leg from his knee down which has been ongoing for past 6 years after he had leg surgery.  Left toe was found to be ulcerated with purulent discharge and associated edema down to the foot.  Found to have left big toe osteomyelitis.  Orthopedic surgery was consulted.  Patient underwent amputation of left big toe.    Subjective   Patient seen and examined, underwent amputation of left big toe.  Denies pain.  Wants to walk with a walker.   Assessment/Plan:    Osteomyelitis left big toe -S/p left big toe amputation -Continue cefepime, vancomycin -We will discuss with Ortho regarding duration of antibiotics  Diabetes mellitus type 2 -Poorly controlled; hemoglobin A1c 12.6 -Continue sliding scale insulin with NovoLog    Scheduled medications:    vitamin C  1,000 mg Oral Daily   [START ON 11/12/2020] Chlorhexidine Gluconate Cloth  6 each Topical Q0600   [START ON 11/12/2020] docusate sodium  100 mg Oral Daily   [START ON 11/15/2020] enoxaparin (LOVENOX) injection  40 mg Subcutaneous Q24H   feeding supplement  296 mL Oral Once   insulin aspart  0-15 Units Subcutaneous TID WC   insulin aspart  0-5 Units Subcutaneous QHS   mupirocin ointment  1 application Nasal BID   [START ON 11/12/2020] nutrition supplement (JUVEN)  1 packet Oral BID BM   pantoprazole  40 mg Oral Daily   sodium chloride flush  3 mL Intravenous Q12H   zinc sulfate  220 mg Oral Daily         Data Reviewed:   CBG:  Recent Labs  Lab 11/11/20 0744 11/11/20 1221 11/11/20 1414 11/11/20 1446 11/11/20 1708  GLUCAP 203* 226* 212* 197* 173*    SpO2: 100 %    Vitals:   11/11/20 1500 11/11/20 1531 11/11/20 1714 11/11/20 1722  BP:  120/72 130/78  107/73  Pulse: 80 78 86 86  Resp: 10 16 18    Temp: 98.3 F (36.8 C) 97.7 F (36.5 C) 97.6 F (36.4 C)   TempSrc:  Oral Oral   SpO2: 96% 99% 100%   Weight:      Height:         Intake/Output Summary (Last 24 hours) at 11/11/2020 1803 Last data filed at 11/11/2020 1709 Gross per 24 hour  Intake 2131.8 ml  Output --  Net 2131.8 ml    08/01 1901 - 08/03 0700 In: 2700.5 [P.O.:480; I.V.:953.3] Out: -   Filed Weights   11/10/20 0024  Weight: 83.9 kg    CBC:  Recent Labs  Lab 11/10/20 0408 11/11/20 0035  WBC 12.4* 9.9  HGB 13.3 13.1  HCT 38.4* 39.2  PLT 237 242  MCV 84.0 85.4  MCH 29.1 28.5  MCHC 34.6 33.4  RDW 11.8 11.7  LYMPHSABS 1.8 1.8  MONOABS 1.0 0.8  EOSABS 0.2 0.3  BASOSABS 0.1 0.1    Complete metabolic panel:  Recent Labs  Lab 11/10/20 0408 11/11/20 0035  NA 130* 132*  K 4.1 3.9  CL 91* 95*  CO2 27 26  GLUCOSE 325* 189*  BUN 21* 16  CREATININE 0.73 0.70  CALCIUM 9.4 9.4  MG  --  2.0  CRP  --  8.9*  HGBA1C  --  12.6*    No results for input(s): LIPASE, AMYLASE in the last 168 hours.  Recent Labs  Lab 11/10/20 0408 11/11/20 0035  CRP  --  8.9*  SARSCOV2NAA NEGATIVE  --     ------------------------------------------------------------------------------------------------------------------ No results for input(s): CHOL, HDL, LDLCALC, TRIG, CHOLHDL, LDLDIRECT in the last 72 hours.  Lab Results  Component Value Date   HGBA1C 12.6 (H) 11/11/2020   ------------------------------------------------------------------------------------------------------------------ No results for input(s): TSH, T4TOTAL, T3FREE, THYROIDAB in the last 72 hours.  Invalid input(s): FREET3 ------------------------------------------------------------------------------------------------------------------ No results for input(s): VITAMINB12, FOLATE, FERRITIN, TIBC, IRON, RETICCTPCT in the last 72 hours.  Coagulation profile No results for input(s):  INR, PROTIME in the last 168 hours. No results for input(s): DDIMER in the last 72 hours.  Cardiac Enzymes No results for input(s): CKTOTAL, CKMB, CKMBINDEX, TROPONINI in the last 168 hours.  ------------------------------------------------------------------------------------------------------------------ No results found for: BNP   Antibiotics: Anti-infectives (From admission, onward)    Start     Dose/Rate Route Frequency Ordered Stop   11/10/20 1600  vancomycin (VANCOREADY) IVPB 1500 mg/300 mL        1,500 mg 150 mL/hr over 120 Minutes Intravenous Every 12 hours 11/10/20 1504     11/10/20 1600  ceFEPIme (MAXIPIME) 2 g in sodium chloride 0.9 % 100 mL IVPB        2 g 200 mL/hr over 30 Minutes Intravenous Every 8 hours 11/10/20 1504     11/10/20 1545  vancomycin (VANCOCIN) IVPB 1000 mg/200 mL premix  Status:  Discontinued        1,000 mg 200 mL/hr over 60 Minutes Intravenous  Once 11/10/20 1451 11/10/20 1451   11/10/20 0515  vancomycin (VANCOCIN) IVPB 1000 mg/200 mL premix        1,000 mg 200 mL/hr over 60 Minutes Intravenous  Once 11/10/20 0506 11/10/20 0646   11/10/20 0515  piperacillin-tazobactam (ZOSYN) IVPB 3.375 g        3.375 g 100 mL/hr over 30 Minutes Intravenous  Once 11/10/20 0506 11/10/20 0542        Radiology Reports  DG Tibia/Fibula Left  Result Date: 11/10/2020 CLINICAL DATA:  Left leg and foot wounds. Possible infection 3 weeks ago. EXAM: LEFT TIBIA AND FIBULA - 2 VIEW COMPARISON:  06/08/2019 FINDINGS: Osteopenia and cortical thickening from venous stasis last interosseous insertional changes. Soft tissue swelling diffusely without soft tissue gas or cortical erosion. No acute fracture or subluxation. IMPRESSION: Nonspecific generalized soft tissue swelling. No fracture or erosion. Electronically Signed   By: Marnee Spring M.D.   On: 11/10/2020 05:30   DG Foot Complete Left  Result Date: 11/10/2020 CLINICAL DATA:  Left leg and foot wounds. Possible infection  for 3 weeks EXAM: LEFT FOOT - COMPLETE 3+ VIEW COMPARISON:  None. FINDINGS: Irregularity of the great toe nail/tip with erosion of the distal phalanx cortex at the tuft. No acute fracture or subluxation. First MTP osteoarthritis. IMPRESSION: Tuft erosion of the great toe consistent with osteomyelitis. Electronically Signed   By: Marnee Spring M.D.   On: 11/10/2020 05:29      DVT prophylaxis: SCDs  Code Status: Full code  Family Communication: No family at bedside   Consultants: Orthopedics  Procedures: Left big toe amputation    Objective    Physical Examination:   General-appears in no acute distress Heart-S1-S2, regular, no murmur auscultated Lungs-clear to auscultation bilaterally, no wheezing or crackles auscultated Abdomen-soft, nontender, no organomegaly Extremities-no edema in the lower extremities Neuro-alert, oriented x3, no focal  deficit noted  Status is: Inpatient  Dispo: The patient is from: Home              Anticipated d/c is to: Home              Anticipated d/c date is: 11/13/2020              Patient currently not stable for discharge  Barrier to discharge-s/p left amputation  COVID-19 Labs  Recent Labs    11/11/20 0035  CRP 8.9*    Lab Results  Component Value Date   SARSCOV2NAA NEGATIVE 11/10/2020   SARSCOV2NAA POSITIVE (A) 04/04/2020    Microbiology  Recent Results (from the past 240 hour(s))  Resp Panel by RT-PCR (Flu A&B, Covid) Nasopharyngeal Swab     Status: None   Collection Time: 11/10/20  4:08 AM   Specimen: Nasopharyngeal Swab; Nasopharyngeal(NP) swabs in vial transport medium  Result Value Ref Range Status   SARS Coronavirus 2 by RT PCR NEGATIVE NEGATIVE Final    Comment: (NOTE) SARS-CoV-2 target nucleic acids are NOT DETECTED.  The SARS-CoV-2 RNA is generally detectable in upper respiratory specimens during the acute phase of infection. The lowest concentration of SARS-CoV-2 viral copies this assay can detect is 138  copies/mL. A negative result does not preclude SARS-Cov-2 infection and should not be used as the sole basis for treatment or other patient management decisions. A negative result may occur with  improper specimen collection/handling, submission of specimen other than nasopharyngeal swab, presence of viral mutation(s) within the areas targeted by this assay, and inadequate number of viral copies(<138 copies/mL). A negative result must be combined with clinical observations, patient history, and epidemiological information. The expected result is Negative.  Fact Sheet for Patients:  BloggerCourse.comhttps://www.fda.gov/media/152166/download  Fact Sheet for Healthcare Providers:  SeriousBroker.ithttps://www.fda.gov/media/152162/download  This test is no t yet approved or cleared by the [place] FDA and  has been authorized for detection and/or diagnosis of SARS-CoV-2 by FDA under an Emergency Use Authorization (EUA). This EUA will remain  in effect (meaning this test can be used) for the duration of the COVID-19 declaration under Section 564(b)(1) of the Act, 21 U.S.C.section 360bbb-3(b)(1), unless the authorization is terminated  or revoked sooner.       Influenza A by PCR NEGATIVE NEGATIVE Final   Influenza B by PCR NEGATIVE NEGATIVE Final    Comment: (NOTE) The Xpert Xpress SARS-CoV-2/FLU/RSV plus assay is intended as an aid in the diagnosis of influenza from Nasopharyngeal swab specimens and should not be used as a sole basis for treatment. Nasal washings and aspirates are unacceptable for Xpert Xpress SARS-CoV-2/FLU/RSV testing.  Fact Sheet for Patients: BloggerCourse.comhttps://www.fda.gov/media/152166/download  Fact Sheet for Healthcare Providers: SeriousBroker.ithttps://www.fda.gov/media/152162/download  This test is not yet approved or cleared by the [place] FDA and has been authorized for detection and/or diagnosis of SARS-CoV-2 by FDA under an Emergency Use Authorization (EUA). This EUA will remain in effect (meaning  this test can be used) for the duration of the COVID-19 declaration under Section 564(b)(1) of the Act, 21 U.S.C. section 360bbb-3(b)(1), unless the authorization is terminated or revoked.  Performed at [facility], [address] [place], [place] [id number]   Blood culture (routine x 2)     Status: None (Preliminary result)   Collection Time: 11/10/20  4:08 AM   Specimen: BLOOD LEFT FOREARM  Result Value Ref Range Status   Specimen Description   Final    BLOOD LEFT FOREARM Performed at [facility]  [address], [address] [employer] [place], [place] [address]    Special Requests   Final    BOTTLES DRAWN AEROBIC AND ANAEROBIC Blood Culture adequate volume Performed at [facility], [address] [place], [place] [address]    Culture   Final    NO GROWTH < 24 HOURS Performed at [facility], [address] [place], [place] [address]    Report Status PENDING  Incomplete  Blood culture (routine x 2)     Status: None (Preliminary result)   Collection Time: 11/10/20  4:08 AM   Specimen: BLOOD  Result Value Ref Range Status   Specimen Description   Final    BLOOD LEFT ANTECUBITAL Performed at [facility], [address] [place], [place] [address]    Special Requests   Final    BOTTLES DRAWN AEROBIC AND ANAEROBIC Blood Culture adequate volume Performed at [facility], [address] [place], [place] [address]    Culture   Final    NO GROWTH < 24 HOURS Performed at [facility], [address] [place], [place] [address]    Report Status PENDING  Incomplete  Surgical pcr screen     Status: Abnormal   Collection Time: 11/11/20 10:29 AM   Specimen: Nasal Mucosa; Nasal Swab  Result Value Ref Range Status   MRSA, PCR POSITIVE (A) NEGATIVE Final    Comment: RESULT CALLED TO, READ BACK BY AND VERIFIED WITH: RN Alferd Apa 11/11/20@1218  BY TW    Staphylococcus aureus POSITIVE (A) NEGATIVE Final    Comment: (NOTE) The Xpert SA  Assay (FDA approved for NASAL specimens in patients 23 years of age and older), is one component of a comprehensive surveillance program. It is not intended to diagnose infection nor to guide or monitor treatment. Performed at [facility], [address] [place], [place] [address]              Meredeth Ide   [facility] If 7PM-7AM, please contact night-coverage at [email or web address], Office  [phone]   11/11/2020, 6:03 PM  LOS: 1 day

## 2020-11-11 NOTE — Progress Notes (Signed)
Occupational Therapy Evaluation Patient Details Name: Tommy Scott MRN: [id number] DOB: May 28, 1965 Today's Date: 11/11/2020    History of Present Illness Tommy Scott is a 55 yo male who presented with ongoing L foot pain for about 3 weeks, he is now s/p L great tow amputation 8/3. PMH includes DMII and leg surgery 6 years ago which left him with no sensation in the L leg from the knee down.   Clinical Impression   Tommy Scott was evaluated s/p the above toe ambulation. 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 Upon evaluation pt was mod I for bed mobility and eager to get OOB. Given verbal and visual cues, pt maintained WB status well with sit<>stand given min guard. He was also min guard for ~5ft of hops with RW, vc for technique. He completed LB dressing tasks with minA, and upper with set up A. He is eager to return to work and demonstrates poor insight to deficits and safety. He benefits from continued OT acutely to progress all function toward mod I prior to D/c. Recommend d/c home with supervision initially for all ADLs and mobility.     Follow Up Recommendations  No OT follow up;Supervision - Intermittent    Equipment Recommendations  Tub/shower bench (RW)       Precautions / Restrictions Precautions Precautions: Fall Required Braces or Orthoses: Other Brace Other Brace: post-op shoe Restrictions Weight Bearing Restrictions: Yes LLE Weight Bearing: Touchdown weight bearing      Mobility Bed Mobility Overal bed mobility: Modified Independent                  Transfers Overall transfer level: Needs assistance Equipment used: Rolling walker (2 wheeled) Transfers: Sit to/from [employer] Sit to Stand: Min guard Stand pivot transfers: Min guard;From elevated surface       General transfer comment: vc for hopping technqiue - pt with good ability to maintain WB    Balance Overall balance assessment: Needs  assistance Sitting-balance support: Single extremity supported Sitting balance-Leahy Scale: Good     Standing balance support: Single extremity supported;No upper extremity supported Standing balance-Leahy Scale: Fair                             ADL either performed or assessed with clinical judgement   ADL Overall ADL's : Needs assistance/impaired Eating/Feeding: Independent;Sitting   Grooming: Set up;Sitting   Upper Body Bathing: Supervision/ safety;Set up;Sitting   Lower Body Bathing: Sit to/from stand;Minimal assistance   Upper Body Dressing : Set up;Sitting   Lower Body Dressing: Minimal assistance;Sit to/from stand Lower Body Dressing Details (indicate cue type and reason): pt donned underear, min A to pull garment up in the back Toilet Transfer: Min guard;Stand-pivot;BSC   Toileting- Clothing Manipulation and Hygiene: Supervision/safety;Sitting/lateral lean       Functional mobility during ADLs: Min guard;Rolling walker;Cueing for safety;Cueing for sequencing General ADL Comments: pt is impulsive and benefits from gentle cues     Vision Baseline Vision/History: No visual deficits Patient Visual Report: No change from baseline Vision Assessment?: No apparent visual deficits      Hand Dominance Right   Extremity/Trunk Assessment Upper Extremity Assessment Upper Extremity Assessment: Overall WFL for tasks assessed (bilat hands have limited ROM, strength is 4/5)   Lower Extremity Assessment Lower Extremity Assessment: Defer to PT evaluation   Cervical / Trunk Assessment Cervical / Trunk Assessment: Normal   Communication Communication Communication:  No difficulties   Cognition Arousal/Alertness: Awake/alert Behavior During Therapy: WFL for tasks assessed/performed Overall Cognitive Status: Within Functional Limits for tasks assessed                                 General Comments: slow processing, incrased time   General  Comments  L foot wrapped, post op shoe donned fo OOB mobility and doffed in bed. Pt urinated with urnial while sitting EOB. No comlpaints of pain            Home Living Family/patient expects to be discharged to:: Private residence Living Arrangements: Alone Available Help at Discharge: Family;Available PRN/intermittently Type of Home: House Home Access: Ramped entrance Entrance Stairs-Number of Steps: 3 steps - side entrnace   Home Layout: One level     Bathroom Shower/Tub: Tub/shower unit;Curtain   Firefighter: Standard     Home Equipment: Environmental consultant - 2 wheels;Cane - single point          Prior Functioning/Environment Level of Independence: Independent        Comments: drives, works as a Programme researcher, broadcasting/film/video Problem List: Decreased strength;Decreased range of motion;Decreased activity tolerance;Impaired balance (sitting and/or standing);Decreased safety awareness;Decreased knowledge of use of DME or AE;Decreased knowledge of precautions;Pain      OT Treatment/Interventions: Self-care/ADL training;Therapeutic exercise;DME and/or AE instruction;Therapeutic activities;Patient/family education;Balance training    OT Goals(Current goals can be found in the care plan section) Acute Rehab OT Goals Patient Stated Goal: get back to working soon OT Goal Formulation: With patient Time For Goal Achievement: 11/25/20 Potential to Achieve Goals: Good ADL Goals Pt Will Perform Lower Body Bathing: with modified independence;sit to/from stand Pt Will Perform Lower Body Dressing: with modified independence;sit to/from stand Pt Will Transfer to Toilet: with modified independence;ambulating;regular height toilet Pt Will Perform Toileting - Clothing Manipulation and hygiene: with modified independence;sitting/lateral leans Pt Will Perform Tub/Shower Transfer: with supervision;ambulating;rolling walker;tub bench  OT Frequency: Min 2X/week   Barriers to D/C: Decreased caregiver  support  pt lives alone          AM-PAC OT "6 Clicks" Daily Activity     Outcome Measure Help from another person eating meals?: None Help from another person taking care of personal grooming?: A Little Help from another person toileting, which includes using toliet, bedpan, or urinal?: A Little Help from another person bathing (including washing, rinsing, drying)?: A Little Help from another person to put on and taking off regular upper body clothing?: None Help from another person to put on and taking off regular lower body clothing?: A Little 6 Click Score: 20   End of Session Equipment Utilized During Treatment: Gait belt;Rolling walker (post op shoe) Nurse Communication: Mobility status;Precautions;Weight bearing status  Activity Tolerance: Patient tolerated treatment well Patient left: in bed  OT Visit Diagnosis: Unsteadiness on feet (R26.81);Muscle weakness (generalized) (M62.81);Pain                Time: 1431-1453 OT Time Calculation (min): 22 min Charges:  OT General Charges $OT Visit: 1 Visit OT Evaluation $OT Eval Moderate Complexity: 1 Mod    Isys Tietje A Jiselle Sheu 11/11/2020, 5:17 PM

## 2020-11-11 NOTE — Consult Note (Signed)
ORTHOPAEDIC CONSULTATION  REQUESTING PHYSICIAN: Meredeth Ide, MD  Chief Complaint: Chronic osteomyelitis left great toe  HPI: Tommy Scott is a 55 y.o. male who presents with chronic osteomyelitis left great toe with swelling cellulitis ulcer and drainage.  Past Medical History:  Diagnosis Date   Diabetes mellitus without complication (HCC)    Past Surgical History:  Procedure Laterality Date   LEG SURGERY     Social History   Socioeconomic History   Marital status: Divorced    Spouse name: Not on file   Number of children: Not on file   Years of education: Not on file   Highest education level: Not on file  Occupational History   Not on file  Tobacco Use   Smoking status: Never   Smokeless tobacco: Never  Vaping Use   Vaping Use: Never used  Substance and Sexual Activity   Alcohol use: Not Currently   Drug use: Never   Sexual activity: Not on file  Other Topics Concern   Not on file  Social History Narrative   Not on file   Social Determinants of Health   Financial Resource Strain: Not on file  Food Insecurity: Not on file  Transportation Needs: Not on file  Physical Activity: Not on file  Stress: Not on file  Social Connections: Not on file   Family History  Problem Relation Age of Onset   Stroke Mother    Diabetes Father    - negative except otherwise stated in the family history section Allergies  Allergen Reactions   Sulfa Antibiotics Nausea Only and Other (See Comments)    Pt states that the medication caused chills, body aches & caused him to pee blood   Prior to Admission medications   Medication Sig Start Date End Date Taking? Authorizing Provider  BAYER BACK & BODY PAIN EX ST 500-32.5 MG TABS Take 1 tablet by mouth every 6 (six) hours as needed (for pain).   Yes [provider]  glipiZIDE (GLUCOTROL) 10 MG tablet    Yes [provider]   DG Tibia/Fibula Left  Result Date: 11/10/2020 CLINICAL DATA:  Left leg and  foot wounds. Possible infection 3 weeks ago. EXAM: LEFT TIBIA AND FIBULA - 2 VIEW COMPARISON:  06/08/2019 FINDINGS: Osteopenia and cortical thickening from venous stasis last interosseous insertional changes. Soft tissue swelling diffusely without soft tissue gas or cortical erosion. No acute fracture or subluxation. IMPRESSION: Nonspecific generalized soft tissue swelling. No fracture or erosion. Electronically Signed   By: Marnee Spring M.D.   On: 11/10/2020 05:30   DG Foot Complete Left  Result Date: 11/10/2020 CLINICAL DATA:  Left leg and foot wounds. Possible infection for 3 weeks EXAM: LEFT FOOT - COMPLETE 3+ VIEW COMPARISON:  None. FINDINGS: Irregularity of the great toe nail/tip with erosion of the distal phalanx cortex at the tuft. No acute fracture or subluxation. First MTP osteoarthritis. IMPRESSION: Tuft erosion of the great toe consistent with osteomyelitis. Electronically Signed   By: Marnee Spring M.D.   On: 11/10/2020 05:29   - pertinent xrays, CT, MRI studies were reviewed and independently interpreted  Positive ROS: All other systems have been reviewed and were otherwise negative with the exception of those mentioned in the HPI and as above.  Physical Exam: General: Alert, no acute distress Psychiatric: Patient is competent for consent with normal mood and affect Lymphatic: No axillary or cervical lymphadenopathy Cardiovascular: No pedal edema Respiratory: No cyanosis, no use of accessory musculature GI: No  organomegaly, abdomen is soft and non-tender    Images:  @ENCIMAGES @  Labs:  Lab Results  Component Value Date   HGBA1C 12.6 (H) 11/11/2020   ESRSEDRATE 37 (H) 11/11/2020   CRP 8.9 (H) 11/11/2020   REPTSTATUS PENDING 11/10/2020   REPTSTATUS PENDING 11/10/2020   CULT  11/10/2020    NO GROWTH < 24 HOURS Performed at [facility], [address], [place], [place]    CULT  11/10/2020    NO GROWTH < 24 HOURS Performed at [facility]  Lab, [address] [place], [place]     No results found for: ALBUMIN, PREALBUMIN, LABURIC   CBC EXTENDED Latest Ref Rng & Units 11/11/2020 11/10/2020 04/05/2020  WBC 4.0 - 10.5 K/uL 9.9 12.4(H) 10.4  RBC 4.22 - 5.81 MIL/uL 4.59 4.57 4.98  HGB 13.0 - 17.0 g/dL 04/07/2020 82.9 56.2  HCT 13.0 - 52.0 % 39.2 38.4(L) 43.4  PLT 150 - 400 K/uL 242 237 185  NEUTROABS 1.7 - 7.7 K/uL 7.0 9.3(H) 8.5(H)  LYMPHSABS 0.7 - 4.0 K/uL 1.8 1.8 0.7    Neurologic: Patient does not have protective sensation bilateral lower extremities.   MUSCULOSKELETAL:   Skin: Examination there is sausage digit swelling of the left great toe.  There is a plantar ulcer with cellulitis extending up to the MTP joint.  Patient has palpable pulses.  White cell count 9.9.  Hemoglobin A1c 12.6 with a sed rate of 37 and C-reactive protein of 8.9.  Radiographs show destructive bony changes of the tuft of the left great toe.  No ascending cellulitis through the foot or ankle.  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  Risks and benefits were discussed including risk of the wound not healing need for additional surgery the importance of minimizing weightbearing.  Patient states he understands wished proceed at this time.  Thank you for the consult and the opportunity to see Mr. Tommy Moll, MD [facility] [phone] 2:12 PM

## 2020-11-11 NOTE — Anesthesia Postprocedure Evaluation (Signed)
Anesthesia Post Note  Patient: Tommy Scott  Procedure(s) Performed: AMPUTATION OF GREAT TOE (Left)     Patient location during evaluation: PACU Anesthesia Type: MAC Level of consciousness: awake and alert and oriented Pain management: pain level controlled Vital Signs Assessment: post-procedure vital signs reviewed and stable Respiratory status: spontaneous breathing, nonlabored ventilation and respiratory function stable Cardiovascular status: blood pressure returned to baseline Postop Assessment: no apparent nausea or vomiting Anesthetic complications: no   No notable events documented.  Last Vitals:  Vitals:   11/11/20 1445 11/11/20 1500  BP: 103/67 120/72  Pulse: 80 80  Resp: 12 10  Temp: 36.7 C 36.8 C  SpO2: 96% 96%    Last Pain:  Vitals:   11/11/20 1500  TempSrc:   PainSc: 0-No pain                 Brennan Bailey

## 2020-11-11 NOTE — Op Note (Signed)
11/11/2020  2:39 PM  PATIENT:  Tommy Scott    PRE-OPERATIVE DIAGNOSIS:  osteomylitis  POST-OPERATIVE DIAGNOSIS:  Same  PROCEDURE:  AMPUTATION OF GREAT TOE  SURGEON:  Newt Minion, MD  PHYSICIAN ASSISTANT:None ANESTHESIA:   General  PREOPERATIVE INDICATIONS:  [name] is a  55 y.o. male with a diagnosis of osteomylitis who failed conservative measures and elected for surgical management.    The risks benefits and alternatives were discussed with the patient preoperatively including but not limited to the risks of infection, bleeding, nerve injury, cardiopulmonary complications, the need for revision surgery, among others, and the patient was willing to proceed.  OPERATIVE IMPLANTS: None  _0 @  OPERATIVE FINDINGS: Good petechial bleeding no abscess at the level of amputation.  OPERATIVE PROCEDURE: Patient brought the operating room and underwent a MAC anesthetic.  The left lower extremity was then prepped using DuraPrep draped into a sterile field a timeout was called.  Patient underwent a digital block with 18 cc of 1% lidocaine plain.  A fishmouth incision was then made distal to the MTP joint this was carried down through the joint the toe was amputated electrocautery was used hemostasis the wound was irrigated with normal saline incision closed using 2-0 nylon a sterile dressing was applied patient was taken the PACU in stable condition.   DISCHARGE PLANNING:  Antibiotic duration: Continue antibiotics for 24 hours  Weightbearing: Touchdown weightbearing on the left  Pain medication: Opioid pathway  Dressing care/ Wound VAC: Reinforce dressing as needed  Ambulatory devices: Walker or crutches  Discharge to: Anticipate discharge to home.  Follow-up: In the office 1 week post operative.

## 2020-11-11 NOTE — Interval H&P Note (Signed)
History and Physical Interval Note:  11/11/2020 2:12 PM  Tommy Scott  has presented today for surgery, with the diagnosis of osteomylitis.  The various methods of treatment have been discussed with the patient and family. After consideration of risks, benefits and other options for treatment, the patient has consented to  Procedure(s): AMPUTATION OF GREAT TOE (Left) as a surgical intervention.  The patient's history has been reviewed, patient examined, no change in status, stable for surgery.  I have reviewed the patient's chart and labs.  Questions were answered to the patient's satisfaction.     Tommy Scott

## 2020-11-12 ENCOUNTER — Encounter (HOSPITAL_COMMUNITY): Payer: Self-pay | Admitting: Orthopedic Surgery

## 2020-11-12 LAB — CBC WITH DIFFERENTIAL/PLATELET
Abs Immature Granulocytes: 0.01 10*3/uL (ref 0.00–0.07)
Basophils Absolute: 0.1 10*3/uL (ref 0.0–0.1)
Basophils Relative: 1 %
Eosinophils Absolute: 0.3 10*3/uL (ref 0.0–0.5)
Eosinophils Relative: 5 %
HCT: 36.9 % — ABNORMAL LOW (ref 39.0–52.0)
Hemoglobin: 12.1 g/dL — ABNORMAL LOW (ref 13.0–17.0)
Immature Granulocytes: 0 %
Lymphocytes Relative: 25 %
Lymphs Abs: 1.8 10*3/uL (ref 0.7–4.0)
MCH: 28.1 pg (ref 26.0–34.0)
MCHC: 32.8 g/dL (ref 30.0–36.0)
MCV: 85.8 fL (ref 80.0–100.0)
Monocytes Absolute: 0.8 10*3/uL (ref 0.1–1.0)
Monocytes Relative: 11 %
Neutro Abs: 4.3 10*3/uL (ref 1.7–7.7)
Neutrophils Relative %: 58 %
Platelets: 221 10*3/uL (ref 150–400)
RBC: 4.3 MIL/uL (ref 4.22–5.81)
RDW: 11.8 % (ref 11.5–15.5)
WBC: 7.4 10*3/uL (ref 4.0–10.5)
nRBC: 0 % (ref 0.0–0.2)

## 2020-11-12 LAB — BASIC METABOLIC PANEL
Anion gap: 7 (ref 5–15)
BUN: 13 mg/dL (ref 6–20)
CO2: 29 mmol/L (ref 22–32)
Calcium: 9 mg/dL (ref 8.9–10.3)
Chloride: 99 mmol/L (ref 98–111)
Creatinine, Ser: 0.73 mg/dL (ref 0.61–1.24)
GFR, Estimated: >60 mL/min (ref >60)
Glucose, Bld: 240 mg/dL — ABNORMAL HIGH (ref 70–99)
Potassium: 4.4 mmol/L (ref 3.5–5.1)
Sodium: 135 mmol/L (ref 135–145)

## 2020-11-12 LAB — MAGNESIUM: Magnesium: 2.1 mg/dL (ref 1.7–2.4)

## 2020-11-12 LAB — GLUCOSE, CAPILLARY
Glucose-Capillary: 274 mg/dL — ABNORMAL HIGH (ref 70–99)
Glucose-Capillary: 368 mg/dL — ABNORMAL HIGH (ref 70–99)

## 2020-11-12 MED ORDER — METFORMIN HCL 500 MG PO TABS: 500.0000 mg | ORAL_TABLET | Freq: Two times a day (BID) | ORAL | 11 refills | Status: AC

## 2020-11-12 NOTE — Evaluation (Signed)
Physical Therapy Evaluation Patient Details Name: Tommy Scott MRN: [id number] DOB: [birth year] Today's Date: 11/12/2020   History of Present Illness  Tommy Scott is a 55 yo male who presented with ongoing L foot pain for about 3 weeks, he is now s/p L great tow amputation 8/3. PMH includes DMII and leg surgery 6 years ago which left him with no sensation in the L leg from the knee down.  Clinical Impression  Pt presents with generalized weakness, impaired sensation with sores bilat LE's, decreased knowledge and application of WB status LLE, impaired gait, and decreased activity tolerance. Pt to benefit from acute PT to address deficits. Pt ambulated short hallway distance with use of RW and close guard for safety, requires cues for form/safety throughout. PT to progress mobility as tolerated, and will continue to follow acutely.      Follow Up Recommendations Outpatient PT    Equipment Recommendations  None recommended by PT    Recommendations for Other Services       Precautions / Restrictions Precautions Precautions: Fall Required Braces or Orthoses: Other Brace Other Brace: post-op shoe Restrictions Weight Bearing Restrictions: Yes LLE Weight Bearing: Touchdown weight bearing      Mobility  Bed Mobility Overal bed mobility: Modified Independent             General bed mobility comments: no physical assist, increased time    Transfers Overall transfer level: Needs assistance Equipment used: Rolling walker (2 wheeled) Transfers: Sit to/from Stand Sit to Stand: Min guard         General transfer comment: for safety, cues for hand placement when rising/sitting  Ambulation/Gait Ambulation/Gait assistance: Min guard Gait Distance (Feet): 65 Feet Assistive device: Rolling walker (2 wheeled) Gait Pattern/deviations: Step-to pattern;Decreased weight shift to left;Trunk flexed Gait velocity: decr   General Gait Details: close guard for safety, verbal cuing for  sequencing with step-to gait, TDWB LLE, upright posture.  Stairs            Wheelchair Mobility    Modified Rankin (Stroke Patients Only)       Balance Overall balance assessment: Needs assistance Sitting-balance support: Single extremity supported Sitting balance-Leahy Scale: Good     Standing balance support: Single extremity supported;No upper extremity supported Standing balance-Leahy Scale: Fair Standing balance comment: can stand statically without UE support, requires RW dynamically                             Pertinent Vitals/Pain Pain Assessment: No/denies pain    Home Living Family/patient expects to be discharged to:: Private residence Living Arrangements: Alone Available Help at Discharge: Family;Available PRN/intermittently Type of Home: House Home Access: Ramped entrance   Entrance Stairs-Number of Steps: 3 steps - side entrnace Home Layout: One level Home Equipment: Walker - 2 wheels;Cane - single point      Prior Function Level of Independence: Independent         Comments: drives, works as a Science writer: Right    Extremity/Trunk Assessment   Upper Extremity Assessment Upper Extremity Assessment: Defer to OT evaluation    Lower Extremity Assessment Lower Extremity Assessment: RLE deficits/detail;LLE deficits/detail RLE Deficits / Details: wounds to bilat shins, chronic in nature RLE Sensation: history of peripheral neuropathy LLE Deficits / Details: wounds to bilat shins, chronic in nature. Pt reports insensate below knee level LLE Sensation: history of peripheral neuropathy;decreased light touch    Cervical /  Trunk Assessment Cervical / Trunk Assessment: Normal  Communication   Communication: No difficulties  Cognition Arousal/Alertness: Awake/alert Behavior During Therapy: WFL for tasks assessed/performed Overall Cognitive Status: Within Functional Limits for tasks assessed                                  General Comments: decreased insight into deficits      General Comments      Exercises     Assessment/Plan    PT Assessment Patient needs continued PT services  PT Problem List Decreased strength;Decreased mobility;Decreased safety awareness;Decreased activity tolerance;Decreased balance;Decreased knowledge of use of DME;Pain;Decreased skin integrity;Decreased knowledge of precautions       PT Treatment Interventions DME instruction;Therapeutic activities;Gait training;Therapeutic exercise;Patient/family education;Balance training;Functional mobility training;Neuromuscular re-education    PT Goals (Current goals can be found in the Care Plan section)  Acute Rehab PT Goals Patient Stated Goal: get back to working soon (pt knows 2-4 weeks at least) PT Goal Formulation: With patient Time For Goal Achievement: 11/26/20 Potential to Achieve Goals: Good    Frequency Min 3X/week   Barriers to discharge        Co-evaluation               AM-PAC PT "6 Clicks" Mobility  Outcome Measure Help needed turning from your back to your side while in a flat bed without using bedrails?: None Help needed moving from lying on your back to sitting on the side of a flat bed without using bedrails?: None Help needed moving to and from a bed to a chair (including a wheelchair)?: A Little Help needed standing up from a chair using your arms (e.g., wheelchair or bedside chair)?: A Little Help needed to walk in hospital room?: A Little Help needed climbing 3-5 steps with a railing? : A Little 6 Click Score: 20    End of Session Equipment Utilized During Treatment: Gait belt Activity Tolerance: Patient tolerated treatment well Patient left: in chair;with call bell/phone within reach;with chair alarm set Nurse Communication: Mobility status PT Visit Diagnosis: Other abnormalities of gait and mobility (R26.89);Unsteadiness on feet (R26.81)    Time:  7510-2585 PT Time Calculation (min) (ACUTE ONLY): 20 min   Charges:   PT Evaluation $PT Eval Low Complexity: 1 Low          Nyleah Mcginnis S, PT DPT Acute Rehabilitation Services Pager [phone]  Office [phone]   Nyia Tsao E Christain [place] 11/12/2020, 10:33 AM

## 2020-11-12 NOTE — Progress Notes (Signed)
Patient ID: Tommy Scott, male   DOB: 08-13-1965, 55 y.o.   MRN: [id number] Patient is postoperative day 1 left great toe amputation at the MTP joint.  Margins were clear.  Patient may discontinue antibiotics after today.  Okay for discharge today when safe with therapy.  Discussed the importance of minimizing ambulation for the next 2 weeks.  Anticipate he can return to work in 3 to 4 weeks.  I will follow-up in the office in 1 week.

## 2020-11-12 NOTE — Plan of Care (Signed)

## 2020-11-12 NOTE — Progress Notes (Signed)
[name] to be D/C'd  per MD order.  Discussed with the patient and all questions fully answered.  VSS, Skin clean, dry and intact without evidence of skin break down, no evidence of skin tears noted.  IV catheter discontinued intact. Site without signs and symptoms of complications. Dressing and pressure applied.  An After Visit Summary was printed and given to the patient. Patient received prescription.  D/c education completed with patient/family including follow up instructions, medication list, d/c activities limitations if indicated, with other d/c instructions as indicated by MD - patient able to verbalize understanding, all questions fully answered.   Patient instructed to return to ED, call 911, or call MD for any changes in condition.   Patient to be escorted via WC, and D/C home via private auto.

## 2020-11-12 NOTE — Discharge Summary (Addendum)
Physician Discharge Summary  Tommy Scott OZH:[id number] DOB: 01/11/1966 DOA: 11/10/2020  PCP: Pcp, No--- patient states that he sees Tommy Scott  Admit date: 11/10/2020 Discharge date: 11/12/2020  Time spent: 37 minutes  Recommendations for Outpatient Follow-up:  New meds metformin twice a day Needs Chem-7 CBC 1 week Outpatient follow-up Dr. 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 Lajoyce Scott consulted-performed MTP joint amputation 8/3 --Dr. Lajoyce Scott was contacted and confirms patient does not require further antibiotics secondary to clean margins at amputation  patient tolerated the procedure extremely well does not have pain because of his prior insensate neuropathy  I had a long discussion about his blood sugars with the patient on 8/4--it is noted that his A1c is 12.6 but he tells me that his sugars at home have been in the 300s only recently  I believe that without the source of infection/nidus of infection in the toe his sugar will come down-he sees Dr. Mariann Lasterebbie Scott and is willing to retrial metformin 500 twice daily  He will probably require insulin on next A1c if his HbA1c is above 8 and this will need to be discussed in  the outpatient setting with him at that time   Discharge Exam: Vitals:   11/12/20 0416 11/12/20 0751  BP: (!) 141/79 109/70  Pulse: 85 83  Resp: 16 16  Temp: 98.6 F (37 C) 98.6 F (37 C)  SpO2: 98% 100%    Subj on day of d/c   Doing fair no pain tells me he has insensate neuropathy regardless-usually has a circumambulatory gait because of his prior neuropathy  General Exam on discharge  EOMI NCAT disheveled pleasant no distress S1-S2 no murmur Chest clear no added sound no rales rhonchi Abdomen soft no rebound no guarding Left lower extremity wound wrapped  Discharge Instructions   Discharge Instructions     Diet - low sodium heart healthy   Complete by: As directed    Discharge instructions   Complete by: As directed    Do not weight-bear on the left leg until you see Dr. Davene Scott You will require therapy at home probably to help you manipulate and move around and we will order home health to come out You do not need antibiotics for your foot-the source of infection is: I have started you on metformin 500 twice a day to help with sugar control continue to check your sugars with your meter at home   Increase activity slowly   Complete by: As directed    No wound care   Complete by: As directed    Keep wound dressed and do not change dressing unless notified by Dr. Burna Scott subsequently      Allergies as of 11/12/2020       Reactions   Sulfa Antibiotics Nausea Only, Other (  See Comments)   Pt states that the medication caused chills, body aches & caused him to pee blood        Medication List     TAKE these medications    [employer] Back & Body Pain Ex St 500-32.5 MG Tabs Generic drug: Aspirin-Caffeine Take 1 tablet by mouth every 6 (six) hours as needed (for pain).   glipiZIDE 10 MG tablet Commonly known as: GLUCOTROL   metFORMIN 500 MG tablet Commonly known as: Glucophage Take 1 tablet (500 mg total) by mouth 2 (two) times daily with a meal.        Allergies  Allergen Reactions   Sulfa Antibiotics Nausea Only and Other (See Comments)    Pt states that the medication caused chills, body aches & caused him to pee blood    Follow-up Information     Adonis Huguenin, NP Follow up in 1 week(s).   Specialty: Orthopedic Surgery Contact information: [address] [place] [address] [phone]                  The results of significant diagnostics from this hospitalization (including imaging, microbiology, ancillary and laboratory) are listed below for reference.    Significant Diagnostic Studies: DG Tibia/Fibula Left  Result Date: 11/10/2020 CLINICAL DATA:  Left leg and foot wounds. Possible infection 3 weeks ago. EXAM: LEFT TIBIA AND FIBULA - 2 VIEW COMPARISON:  06/08/2019 FINDINGS: Osteopenia and cortical thickening from venous stasis last interosseous insertional changes. Soft tissue swelling diffusely without soft tissue gas or cortical erosion. No acute fracture or subluxation. IMPRESSION: Nonspecific generalized soft tissue swelling. No fracture or erosion. Electronically Signed   By: Marnee Spring M.D.   On: 11/10/2020 05:30   DG Foot Complete Left  Result Date: 11/10/2020 CLINICAL DATA:  Left leg and foot wounds. Possible infection for 3 weeks EXAM: LEFT FOOT - COMPLETE 3+ VIEW COMPARISON:  None. FINDINGS: Irregularity of the great toe nail/tip with erosion of the distal phalanx cortex at the tuft. No acute fracture or subluxation. First MTP osteoarthritis. IMPRESSION: Tuft erosion of the great toe consistent with osteomyelitis. Electronically Signed   By: Marnee Spring M.D.   On: 11/10/2020 05:29    Microbiology: Recent Results (from the past 240 hour(s))  Resp Panel by RT-PCR (Flu A&B, Covid) Nasopharyngeal Swab     Status: None   Collection Time: 11/10/20  4:08 AM   Specimen: Nasopharyngeal Swab; Nasopharyngeal(NP) swabs in vial transport medium  Result Value Ref Range Status   SARS Coronavirus 2 by RT  PCR NEGATIVE NEGATIVE Final    Comment: (NOTE) SARS-CoV-2 target nucleic acids are NOT DETECTED.  The SARS-CoV-2 RNA is generally detectable in upper respiratory specimens during the acute phase of infection. The lowest concentration of SARS-CoV-2 viral copies this assay can detect is 138 copies/mL. A negative result does not preclude SARS-Cov-2 infection and should not be used as the sole basis for treatment or other patient management decisions. A negative result may occur with  improper specimen collection/handling, submission of specimen other than nasopharyngeal swab, presence of viral mutation(s) within the areas targeted by this assay, and inadequate number of viral copies(<138 copies/mL). A negative result must be combined with clinical observations, patient history, and epidemiological information. The expected result is Negative.  Fact Sheet for Patients:  BloggerCourse.com  Fact Sheet for Healthcare Providers:  SeriousBroker.it  This test is no t yet approved or cleared by the [place] FDA and  has been authorized for detection and/or  diagnosis of SARS-CoV-2 by FDA under an Emergency Use Authorization (EUA). This EUA will remain  in effect (meaning this test can be used) for the duration of the COVID-19 declaration under Section 564(b)(1) of the Act, 21 U.S.C.section 360bbb-3(b)(1), unless the authorization is terminated  or revoked sooner.       Influenza A by PCR NEGATIVE NEGATIVE Final   Influenza B by PCR NEGATIVE NEGATIVE Final    Comment: (NOTE) The Xpert Xpress SARS-CoV-2/FLU/RSV plus assay is intended as an aid in the diagnosis of influenza from Nasopharyngeal swab specimens and should not be used as a sole basis for treatment. Nasal washings and aspirates are unacceptable for Xpert Xpress SARS-CoV-2/FLU/RSV testing.  Fact Sheet for Patients: BloggerCourse.com  Fact Sheet for  Healthcare Providers: SeriousBroker.it  This test is not yet approved or cleared by the [place] FDA and has been authorized for detection and/or diagnosis of SARS-CoV-2 by FDA under an Emergency Use Authorization (EUA). This EUA will remain in effect (meaning this test can be used) for the duration of the COVID-19 declaration under Section 564(b)(1) of the Act, 21 U.S.C. section 360bbb-3(b)(1), unless the authorization is terminated or revoked.  Performed at [facility], [address] [place], [place] [address]   Blood culture (routine x 2)     Status: None (Preliminary result)   Collection Time: 11/10/20  4:08 AM   Specimen: BLOOD LEFT FOREARM  Result Value Ref Range Status   Specimen Description   Final    BLOOD LEFT FOREARM Performed at [facility], [address] [place], [place] [address]    Special Requests   Final    BOTTLES DRAWN AEROBIC AND ANAEROBIC Blood Culture adequate volume Performed at [facility], [address] [place], [place] [address]    Culture   Final    NO GROWTH 2 DAYS Performed at [facility], [address], [place], [place] [address]    Report Status PENDING  Incomplete  Blood culture (routine x 2)     Status: None (Preliminary result)   Collection Time: 11/10/20  4:08 AM   Specimen: BLOOD  Result Value Ref Range Status   Specimen Description   Final    BLOOD LEFT ANTECUBITAL Performed at [facility], [address] [place], [place] [address]    Special Requests   Final    BOTTLES DRAWN AEROBIC AND ANAEROBIC Blood Culture adequate volume Performed at [facility], [address] [place], [place] [address]    Culture   Final    NO GROWTH 2 DAYS Performed at [facility], [address], [place], [place] [address]    Report Status PENDING  Incomplete  Surgical pcr screen     Status: Abnormal   Collection Time: 11/11/20 10:29 AM    Specimen: Nasal Mucosa; Nasal Swab  Result Value Ref Range Status   MRSA, PCR POSITIVE (A) NEGATIVE Final    Comment: RESULT CALLED TO, READ BACK BY AND VERIFIED WITH: RN Alferd Apa 11/11/20@1218  BY TW    Staphylococcus aureus POSITIVE (A) NEGATIVE Final    Comment: (NOTE) The Xpert SA Assay (FDA approved for NASAL specimens in patients 15 years of age and older), is one component of a comprehensive surveillance program. It is not intended to diagnose infection nor to guide or monitor treatment. Performed at [facility], [address], [place], [place] [address]  Labs: Basic Metabolic Panel: Recent Labs  Lab 11/10/20 0408 11/11/20 0035 11/12/20 0017  NA 130* 132* 135  K 4.1 3.9 4.4  CL 91* 95* 99  CO2 27 26 29   GLUCOSE 325* 189* 240*  BUN 21* 16 13  CREATININE 0.73 0.70 0.73  CALCIUM 9.4 9.4 9.0  MG  --  2.0 2.1   Liver Function Tests: No results for input(s): AST, ALT, ALKPHOS, BILITOT, PROT, ALBUMIN in the last 168 hours. No results for input(s): LIPASE, AMYLASE in the last 168 hours. No results for input(s): AMMONIA in the last 168 hours. CBC: Recent Labs  Lab 11/10/20 0408 11/11/20 0035 11/12/20 0017  WBC 12.4* 9.9 7.4  NEUTROABS 9.3* 7.0 4.3  HGB 13.3 13.1 12.1*  HCT 38.4* 39.2 36.9*  MCV 84.0 85.4 85.8  PLT 237 242 221   Cardiac Enzymes: No results for input(s): CKTOTAL, CKMB, CKMBINDEX, TROPONINI in the last 168 hours. BNP: BNP (last 3 results) No results for input(s): BNP in the last 8760 hours.  ProBNP (last 3 results) No results for input(s): PROBNP in the last 8760 hours.  CBG: Recent Labs  Lab 11/11/20 1414 11/11/20 1446 11/11/20 1708 11/11/20 2012 11/12/20 0750  GLUCAP 212* 197* 173* 222* 274*       Signed:  01/12/21 MD   [facility] 11/12/2020, 9:25 AM

## 2020-11-12 NOTE — TOC Initial Note (Addendum)
Transition of Care [facility]) - Initial/Assessment Note    Patient Details  Name: Tommy Scott MRN: [id number] Date of Birth: 02/23/1966  Transition of [facility]) CM/SW Contact:    Kingsley Plan, RN Phone Number: 11/12/2020, 11:05 AM  Clinical Narrative:                 Patient from home, has a walker. PT recommendations OP PT, received orders for home health PT. Secure chatted MD for clarification. Patient has transportation to OP PT prefers [place] location. Confirmed phone number . MD wants OP PT   Orders placed for OP PT , information placed on AVS   Expected Discharge Plan: Home/Self Care     Patient Goals and CMS Choice   CMS Medicare.gov Compare Post Acute Care list provided to:: Patient Choice offered to / list presented to : Patient  Expected Discharge Plan and Services Expected Discharge Plan: Home/Self Care   Discharge Planning Services: CM Consult   Living arrangements for the past 2 months: Single Family Home Expected Discharge Date: 11/12/20               DME Arranged: N/A DME Agency: NA                  Prior Living Arrangements/Services Living arrangements for the past 2 months: Single Family Home   Patient language and need for interpreter reviewed:: Yes Do you feel safe going back to the place where you live?: Yes      Need for Family Participation in Patient Care: Yes (Comment) Care giver support system in place?: Yes (comment) Current home services: DME Criminal Activity/Legal Involvement Pertinent to Current Situation/Hospitalization: No - Comment as needed  Activities of Daily Living      Permission Sought/Granted   Permission granted to share information with : No              Emotional Assessment Appearance:: Appears stated age Attitude/Demeanor/Rapport: Engaged Affect (typically observed): Accepting Orientation: : Oriented to Self, Oriented to Place, Oriented to  Time, Oriented to Situation Alcohol / Substance Use: Not  Applicable Psych Involvement: No (comment)  Admission diagnosis:  Osteomyelitis (HCC) [M86.9] Wound infection [T14.8XXA, L08.9] Type 2 diabetes mellitus with other specified complication, without long-term current use of insulin (HCC) [E11.69] Osteomyelitis of left ankle, unspecified type [facility]) [M86.9] Patient Active Problem List   Diagnosis Date Noted   Osteomyelitis of great toe of left foot (HCC) 11/10/2020   DMII (diabetes mellitus, type 2) (HCC) 11/10/2020   PCP:  Pcp, No Pharmacy:   [facility] [place], [place] - [address] [place] [address] Phone: [phone] Fax: [phone]     Social Determinants of Health (SDOH) Interventions    Readmission Risk Interventions No flowsheet data found.

## 2020-11-15 LAB — CULTURE, BLOOD (ROUTINE X 2)
Culture: NO GROWTH
Culture: NO GROWTH
Special Requests: ADEQUATE
Special Requests: ADEQUATE

## 2020-11-20 ENCOUNTER — Other Ambulatory Visit: Payer: Self-pay

## 2020-11-20 ENCOUNTER — Emergency Department (HOSPITAL_BASED_OUTPATIENT_CLINIC_OR_DEPARTMENT_OTHER)
Admission: EM | Admit: 2020-11-20 | Discharge: 2020-11-21 | Disposition: A | Discharge status: Home / Self Care | Payer: Self-pay | Attending: Emergency Medicine | Admitting: Emergency Medicine

## 2020-11-20 ENCOUNTER — Encounter (HOSPITAL_BASED_OUTPATIENT_CLINIC_OR_DEPARTMENT_OTHER): Payer: Self-pay | Admitting: Urology

## 2020-11-20 ENCOUNTER — Emergency Department (HOSPITAL_BASED_OUTPATIENT_CLINIC_OR_DEPARTMENT_OTHER): Payer: Self-pay

## 2020-11-20 DIAGNOSIS — E119 Type 2 diabetes mellitus without complications: Secondary | ICD-10-CM | POA: Insufficient documentation

## 2020-11-20 DIAGNOSIS — X58XXXA Exposure to other specified factors, initial encounter: Secondary | ICD-10-CM | POA: Insufficient documentation

## 2020-11-20 DIAGNOSIS — Z89411 Acquired absence of right great toe: Secondary | ICD-10-CM | POA: Insufficient documentation

## 2020-11-20 DIAGNOSIS — T8140XA Infection following a procedure, unspecified, initial encounter: Secondary | ICD-10-CM | POA: Insufficient documentation

## 2020-11-20 DIAGNOSIS — Z7984 Long term (current) use of oral hypoglycemic drugs: Secondary | ICD-10-CM | POA: Insufficient documentation

## 2020-11-20 LAB — CBC WITH DIFFERENTIAL/PLATELET
Abs Immature Granulocytes: 0.03 10*3/uL (ref 0.00–0.07)
Basophils Absolute: 0.1 10*3/uL (ref 0.0–0.1)
Basophils Relative: 1 %
Eosinophils Absolute: 0.3 10*3/uL (ref 0.0–0.5)
Eosinophils Relative: 3 %
HCT: 40 % (ref 39.0–52.0)
Hemoglobin: 13.6 g/dL (ref 13.0–17.0)
Immature Granulocytes: 0 %
Lymphocytes Relative: 25 %
Lymphs Abs: 2.7 10*3/uL (ref 0.7–4.0)
MCH: 28.9 pg (ref 26.0–34.0)
MCHC: 34 g/dL (ref 30.0–36.0)
MCV: 84.9 fL (ref 80.0–100.0)
Monocytes Absolute: 0.8 10*3/uL (ref 0.1–1.0)
Monocytes Relative: 7 %
Neutro Abs: 6.9 10*3/uL (ref 1.7–7.7)
Neutrophils Relative %: 64 %
Platelets: 291 10*3/uL (ref 150–400)
RBC: 4.71 MIL/uL (ref 4.22–5.81)
RDW: 11.9 % (ref 11.5–15.5)
WBC: 10.9 10*3/uL — ABNORMAL HIGH (ref 4.0–10.5)
nRBC: 0 % (ref 0.0–0.2)

## 2020-11-20 LAB — LACTIC ACID, PLASMA: Lactic Acid, Venous: 1.1 mmol/L (ref 0.5–1.9)

## 2020-11-20 LAB — BASIC METABOLIC PANEL
Anion gap: 9 (ref 5–15)
BUN: 27 mg/dL — ABNORMAL HIGH (ref 6–20)
CO2: 27 mmol/L (ref 22–32)
Calcium: 9.3 mg/dL (ref 8.9–10.3)
Chloride: 96 mmol/L — ABNORMAL LOW (ref 98–111)
Creatinine, Ser: 0.68 mg/dL (ref 0.61–1.24)
GFR, Estimated: >60 mL/min (ref >60)
Glucose, Bld: 336 mg/dL — ABNORMAL HIGH (ref 70–99)
Potassium: 4.4 mmol/L (ref 3.5–5.1)
Sodium: 132 mmol/L — ABNORMAL LOW (ref 135–145)

## 2020-11-20 NOTE — ED Triage Notes (Signed)
Left great toe amputation last wed, sutures bleeding, dressing soiled.

## 2020-11-20 NOTE — ED Provider Notes (Signed)
Emergency Medicine Provider Triage Evaluation Note  Tommy Scott , a 55 y.o. male  was evaluated in triage.  Pt complains of bleeding from amputation site.  Patient had amputation of left toe on 8/3.  Patient reports that he noticed bleeding from amputation site with bandages becoming soiled.  Patient is unsure if he had any purulent discharge.  Denies any fevers or chills.  Patient has numbness to left lower extremity at baseline.  Review of Systems  Positive: Bleeding Negative: Fever, chills  Physical Exam  BP 114/73 (BP Location: Right Arm)   Pulse 87   Temp 98.6 F (37 C) (Oral)   Resp 16   Ht 5\' 10"  (1.778 m)   Wt 83.9 kg   SpO2 98%   BMI 26.54 kg/m  Gen:   Awake, no distress   Resp:  Normal effort  MSK:   Moves extremities without difficulty, left ankle and foot are covered by bandage.  Bandage if soiled with blood, small insects noted on bandage, foul odor Other:    Medical Decision Making  Medically screening exam initiated at 9:54 PM.  Appropriate orders placed.  Tommy Scott was informed that the remainder of the evaluation will be completed by another provider, this initial triage assessment does not replace that evaluation, and the importance of remaining in the ED until their evaluation is complete.  The patient appears stable so that the remainder of the work up may be completed by another provider.      Tommy Scott 11/20/20 2158    2159, MD 12/13/20 [phone]

## 2020-11-20 NOTE — ED Provider Notes (Signed)
[facility] EMERGENCY DEPARTMENT Provider Note   CSN: [id number] Arrival date & time: 11/20/20  1824     History Chief Complaint  Patient presents with   Post-op Problem    Tommy Scott is a 55 y.o. male.  The history is provided by the patient.  Tommy Scott is a 55 y.o. male who presents to the Emergency Department complaining of foot problem. He had an amputation of the left great toe performed on August 3. He was discharged home from the hospital on August 4. He states that he has been trying to mobilize about his home and on Wednesday noticed that there was some bleeding on the dressing. He attempted to call the office on Thursday but did not hear back from anybody so he presents the emergency department today for evaluation. He has no feeling in the foot and has no pain to the area. No fevers, chills, nausea, vomiting, diarrhea. He is not currently on antibiotics. He is taking his glipizide for his diabetes but has been unable to fill his metformin yet due to financial reasons.      Past Medical History:  Diagnosis Date   Diabetes mellitus without complication [facility])     Patient Active Problem List   Diagnosis Date Noted   Osteomyelitis of great toe of left foot (HCC) 11/10/2020   DMII (diabetes mellitus, type 2) (HCC) 11/10/2020    Past Surgical History:  Procedure Laterality Date   AMPUTATION Left 11/11/2020   Procedure: AMPUTATION OF GREAT TOE;  Surgeon: Nadara Mustard, MD;  Location: [facility] OR;  Service: Orthopedics;  Laterality: Left;   LEG SURGERY         Family History  Problem Relation Age of Onset   Stroke Mother    Diabetes Father     Social History   Tobacco Use   Smoking status: Never   Smokeless tobacco: Never  Vaping Use   Vaping Use: Never used  Substance Use Topics   Alcohol use: Not Currently   Drug use: Never    Home Medications Prior to Admission medications   Medication Sig Start Date End Date Taking? Authorizing Provider   cephALEXin (KEFLEX) 500 MG capsule Take 1 capsule (500 mg total) by mouth 4 (four) times daily. 11/21/20  Yes Tilden Fossa, MD  [employer] BACK & BODY PAIN EX ST 500-32.5 MG TABS Take 1 tablet by mouth every 6 (six) hours as needed (for pain).    [provider]  glipiZIDE (GLUCOTROL) 10 MG tablet     [provider]  metFORMIN (GLUCOPHAGE) 500 MG tablet Take 1 tablet (500 mg total) by mouth 2 (two) times daily with a meal. 11/12/20 11/12/21  Rhetta Mura, MD    Allergies    Sulfa antibiotics  Review of Systems   Review of Systems  All other systems reviewed and are negative.  Physical Exam Updated Vital Signs BP 131/73 (BP Location: Right Arm)   Pulse 82   Temp 98.6 F (37 C) (Oral)   Resp 16   Ht 5\' 10"  (1.778 m)   Wt 83.9 kg   SpO2 99%   BMI 26.54 kg/m   Physical Exam Vitals and nursing note reviewed.  Constitutional:      Appearance: He is well-developed.  HENT:     Head: Normocephalic and atraumatic.  Cardiovascular:     Rate and Rhythm: Normal rate and regular rhythm.  Pulmonary:     Effort: Pulmonary effort is normal. No respiratory distress.  Musculoskeletal:  General: No tenderness.     Comments: Amputation site with sutures in place. There is a small amount of serous exudate without significant erythema. 2+ left DP pulse.  Skin:    General: Skin is warm and dry.  Neurological:     Mental Status: He is alert and oriented to person, place, and time.  Psychiatric:        Behavior: Behavior normal.     ED Results / Procedures / Treatments   Labs (all labs ordered are listed, but only abnormal results are displayed) Labs Reviewed  CBC WITH DIFFERENTIAL/PLATELET - Abnormal; Notable for the following components:      Result Value   WBC 10.9 (*)    All other components within normal limits  BASIC METABOLIC PANEL - Abnormal; Notable for the following components:   Sodium 132 (*)    Chloride 96 (*)    Glucose, Bld 336 (*)    BUN  27 (*)    All other components within normal limits  LACTIC ACID, PLASMA  LACTIC ACID, PLASMA    EKG None  Radiology DG Foot Complete Left  Result Date: 11/20/2020 CLINICAL DATA:  Great toe amputation bleeding sutures EXAM: LEFT FOOT - COMPLETE 3+ VIEW COMPARISON:  11/10/2020 FINDINGS: Interval amputation at the level of MTP joint. Bulbous soft tissue thickening at the amputation site. No soft tissue emphysema. Possible tiny erosion at the head of the first metatarsal. IMPRESSION: Status post amputation first digit at the level of the MTP joint with fall with soft tissue swelling at the amputation site. Possible tiny erosion at the head of the first metatarsal, making it difficult to exclude early changes of osteomyelitis. Correlation with MRI should be considered. Electronically Signed   By: Jasmine Pang M.D.   On: 11/20/2020 23:17    Procedures Procedures   Medications Ordered in ED Medications  ceFAZolin (ANCEF) IVPB 2g/100 mL premix (2 g Intravenous New Bag/Given 11/21/20 0055)  sodium chloride 0.9 % bolus 1,000 mL (1,000 mLs Intravenous New Bag/Given 11/21/20 0053)    ED Course  I have reviewed the triage vital signs and the nursing notes.  Pertinent labs & imaging results that were available during my care of the patient were reviewed by me and considered in my medical decision making (see chart for details).    MDM Rules/Calculators/A&P                          patient here for drainage from his great toe amputation site. Wound poor image above. Patient is non-toxic appearing on evaluation. Imaging with questionable osteomyelitis. Discussed with Dr. August Saucer with orthopedics. Recommend starting antibiotics with close follow-up in the office with Dr. Lovina Reach on Monday. Discussed with patient home care as well as orthopedics follow-up. Also counseled patient on importance of taking his metformin. Return precautions discussed.  Final Clinical Impression(s) / ED Diagnoses Final  diagnoses:  Postoperative infection, unspecified type, initial encounter    Rx / DC Orders ED Discharge Orders          Ordered    cephALEXin (KEFLEX) 500 MG capsule  4 times daily        11/21/20 0110             Tilden Fossa, MD 11/21/20 [phone]

## 2020-11-20 NOTE — ED Notes (Signed)
Dressing was removed.

## 2020-11-21 MED ORDER — CEPHALEXIN 500 MG PO CAPS: 500.0000 mg | ORAL_CAPSULE | Freq: Four times a day (QID) | ORAL | 0 refills | Status: DC

## 2020-11-21 MED ORDER — CEFAZOLIN SODIUM-DEXTROSE 2-4 GM/100ML-% IV SOLN
2.0000 g | Freq: Once | INTRAVENOUS | Status: AC
  Administered 2020-11-21: 2 g via INTRAVENOUS
  Filled 2020-11-21: qty 100

## 2020-11-21 MED ORDER — SODIUM CHLORIDE 0.9 % IV BOLUS
1000.0000 mL | Freq: Once | INTRAVENOUS | Status: AC
  Administered 2020-11-21: 1000 mL via INTRAVENOUS

## 2020-11-21 NOTE — ED Notes (Signed)
Dressing was applied back.

## 2020-11-29 ENCOUNTER — Emergency Department (HOSPITAL_BASED_OUTPATIENT_CLINIC_OR_DEPARTMENT_OTHER): Payer: Self-pay

## 2020-11-29 ENCOUNTER — Other Ambulatory Visit: Payer: Self-pay

## 2020-11-29 ENCOUNTER — Emergency Department (HOSPITAL_BASED_OUTPATIENT_CLINIC_OR_DEPARTMENT_OTHER)
Admission: EM | Admit: 2020-11-29 | Discharge: 2020-11-29 | Disposition: A | Discharge status: Home / Self Care | Payer: Self-pay | Attending: Emergency Medicine | Admitting: Emergency Medicine

## 2020-11-29 ENCOUNTER — Encounter (HOSPITAL_BASED_OUTPATIENT_CLINIC_OR_DEPARTMENT_OTHER): Payer: Self-pay | Admitting: Emergency Medicine

## 2020-11-29 DIAGNOSIS — G8918 Other acute postprocedural pain: Secondary | ICD-10-CM

## 2020-11-29 DIAGNOSIS — D72829 Elevated white blood cell count, unspecified: Secondary | ICD-10-CM

## 2020-11-29 DIAGNOSIS — E119 Type 2 diabetes mellitus without complications: Secondary | ICD-10-CM | POA: Insufficient documentation

## 2020-11-29 DIAGNOSIS — Z7984 Long term (current) use of oral hypoglycemic drugs: Secondary | ICD-10-CM | POA: Insufficient documentation

## 2020-11-29 DIAGNOSIS — Z89432 Acquired absence of left foot: Secondary | ICD-10-CM | POA: Insufficient documentation

## 2020-11-29 DIAGNOSIS — T8140XA Infection following a procedure, unspecified, initial encounter: Secondary | ICD-10-CM

## 2020-11-29 LAB — CBC WITH DIFFERENTIAL/PLATELET
Abs Immature Granulocytes: 0.03 10*3/uL (ref 0.00–0.07)
Basophils Absolute: 0.1 10*3/uL (ref 0.0–0.1)
Basophils Relative: 1 %
Eosinophils Absolute: 0.4 10*3/uL (ref 0.0–0.5)
Eosinophils Relative: 3 %
HCT: 38 % — ABNORMAL LOW (ref 39.0–52.0)
Hemoglobin: 13 g/dL (ref 13.0–17.0)
Immature Granulocytes: 0 %
Lymphocytes Relative: 20 %
Lymphs Abs: 2.6 10*3/uL (ref 0.7–4.0)
MCH: 28.8 pg (ref 26.0–34.0)
MCHC: 34.2 g/dL (ref 30.0–36.0)
MCV: 84.1 fL (ref 80.0–100.0)
Monocytes Absolute: 1 10*3/uL (ref 0.1–1.0)
Monocytes Relative: 8 %
Neutro Abs: 9.1 10*3/uL — ABNORMAL HIGH (ref 1.7–7.7)
Neutrophils Relative %: 68 %
Platelets: 211 10*3/uL (ref 150–400)
RBC: 4.52 MIL/uL (ref 4.22–5.81)
RDW: 12 % (ref 11.5–15.5)
WBC: 13.1 10*3/uL — ABNORMAL HIGH (ref 4.0–10.5)
nRBC: 0 % (ref 0.0–0.2)

## 2020-11-29 LAB — BASIC METABOLIC PANEL
Anion gap: 11 (ref 5–15)
BUN: 27 mg/dL — ABNORMAL HIGH (ref 6–20)
CO2: 25 mmol/L (ref 22–32)
Calcium: 9.1 mg/dL (ref 8.9–10.3)
Chloride: 97 mmol/L — ABNORMAL LOW (ref 98–111)
Creatinine, Ser: 0.8 mg/dL (ref 0.61–1.24)
GFR, Estimated: >60 mL/min (ref >60)
Glucose, Bld: 313 mg/dL — ABNORMAL HIGH (ref 70–99)
Potassium: 4.2 mmol/L (ref 3.5–5.1)
Sodium: 133 mmol/L — ABNORMAL LOW (ref 135–145)

## 2020-11-29 MED ORDER — DOXYCYCLINE HYCLATE 100 MG PO CAPS: 100.0000 mg | ORAL_CAPSULE | Freq: Two times a day (BID) | ORAL | 0 refills | Status: DC

## 2020-11-29 NOTE — Discharge Instructions (Addendum)
Please contact Dr. Audrie Lia office first thing tomorrow morning to request a close follow-up appointment.  Ideally you should be seen within the next couple days.  Take the antibiotic as prescribed.  Discussed your antibiotic regimen with Dr. Lajoyce Corners when you see him in clinic.  If you develop worsening swelling, redness, fever, come back to ER for reassessment.

## 2020-11-29 NOTE — ED Triage Notes (Signed)
Pt here for wound check, dressing change; sts he has not been able to get in touch with surgeon for post-op visit; sts sutures are still bleeding and dressing has not been changed since his visit here 11/20/20

## 2020-11-29 NOTE — ED Provider Notes (Signed)
[facility] EMERGENCY DEPARTMENT Provider Note   CSN: [id number] Arrival date & time: 11/29/20  1831     History Chief Complaint  Patient presents with   Dressing Change    Tommy Scott is a 55 y.o. male.  Presenting to the emergency room with concern for wound check, dressing change.  Patient was admitted at the beginning of the month with concern for osteomyelitis of his great toe.  Underwent toe amputation by Dr. Lajoyce Corners.  Patient has not followed up in clinic and has not had sutures removed.  Has pain around the site.  Noted small amount of bleeding around the site as well.  No fevers or chills.  HPI     Past Medical History:  Diagnosis Date   Diabetes mellitus without complication [facility])     Patient Active Problem List   Diagnosis Date Noted   Osteomyelitis of great toe of left foot (HCC) 11/10/2020   DMII (diabetes mellitus, type 2) (HCC) 11/10/2020    Past Surgical History:  Procedure Laterality Date   AMPUTATION Left 11/11/2020   Procedure: AMPUTATION OF GREAT TOE;  Surgeon: Nadara Mustard, MD;  Location: [facility] OR;  Service: Orthopedics;  Laterality: Left;   LEG SURGERY         Family History  Problem Relation Age of Onset   Stroke Mother    Diabetes Father     Social History   Tobacco Use   Smoking status: Never   Smokeless tobacco: Never  Vaping Use   Vaping Use: Never used  Substance Use Topics   Alcohol use: Not Currently   Drug use: Never    Home Medications Prior to Admission medications   Medication Sig Start Date End Date Taking? Authorizing Provider  doxycycline (VIBRAMYCIN) 100 MG capsule Take 1 capsule (100 mg total) by mouth 2 (two) times daily. 11/29/20  Yes Jaylynne Birkhead, [place] Livings, MD  BAYER BACK & BODY PAIN EX ST 500-32.5 MG TABS Take 1 tablet by mouth every 6 (six) hours as needed (for pain).    [provider]  cephALEXin (KEFLEX) 500 MG capsule Take 1 capsule (500 mg total) by mouth 4 (four) times daily. 11/21/20   Tilden Fossa, MD  glipiZIDE (GLUCOTROL) 10 MG tablet     [provider]  metFORMIN (GLUCOPHAGE) 500 MG tablet Take 1 tablet (500 mg total) by mouth 2 (two) times daily with a meal. 11/12/20 11/12/21  Rhetta Mura, MD    Allergies    Sulfa antibiotics  Review of Systems   Review of Systems  Constitutional:  Negative for chills and fever.  HENT:  Negative for ear pain and sore throat.   Eyes:  Negative for pain and visual disturbance.  Respiratory:  Negative for cough and shortness of breath.   Cardiovascular:  Negative for chest pain and palpitations.  Gastrointestinal:  Negative for abdominal pain and vomiting.  Genitourinary:  Negative for dysuria and hematuria.  Musculoskeletal:  Positive for arthralgias. Negative for back pain.  Skin:  Negative for color change and rash.  Neurological:  Negative for seizures and syncope.  All other systems reviewed and are negative.  Physical Exam Updated Vital Signs BP (!) 121/93 (BP Location: Right Arm)   Pulse (!) 102   Temp 98.7 F (37.1 C) (Oral)   Resp 18   Ht 5' 10.5" (1.791 m)   Wt 86.2 kg   SpO2 95%   BMI 26.88 kg/m   Physical Exam Vitals and nursing note reviewed.  Constitutional:  Appearance: He is well-developed.  HENT:     Head: Normocephalic and atraumatic.  Eyes:     Conjunctiva/sclera: Conjunctivae normal.  Cardiovascular:     Rate and Rhythm: Normal rate and regular rhythm.     Heart sounds: No murmur heard. Pulmonary:     Effort: Pulmonary effort is normal. No respiratory distress.     Breath sounds: Normal breath sounds.  Abdominal:     Palpations: Abdomen is soft.     Tenderness: There is no abdominal tenderness.  Musculoskeletal:     Cervical back: Neck supple.     Comments: Left foot: Patient is status post left great toe amputation, sutures are intact, there is mild surrounding erythema, some tenderness around the incision site, no active drainage appreciated  Skin:    General: Skin is  warm and dry.  Neurological:     General: No focal deficit present.     Mental Status: He is alert and oriented to person, place, and time.   Media Information Document Information  Photos  L foot  11/29/2020 20:57  Attached To:  Hospital Encounter on 11/29/20   Source Information  Lovena Kluck, [place] Livings, MD  [facility]    ED Results / Procedures / Treatments   Labs (all labs ordered are listed, but only abnormal results are displayed) Labs Reviewed  CBC WITH DIFFERENTIAL/PLATELET - Abnormal; Notable for the following components:      Result Value   WBC 13.1 (*)    HCT 38.0 (*)    Neutro Abs 9.1 (*)    All other components within normal limits  BASIC METABOLIC PANEL - Abnormal; Notable for the following components:   Sodium 133 (*)    Chloride 97 (*)    Glucose, Bld 313 (*)    BUN 27 (*)    All other components within normal limits    EKG None  Radiology DG Foot Complete Left  Result Date: 11/29/2020 CLINICAL DATA:  Left foot infection EXAM: LEFT FOOT - COMPLETE 3+ VIEW COMPARISON:  11/20/2020 FINDINGS: Postoperative changes with previous amputation of the left first toe at the level of the metatarsal phalangeal joint. Soft tissue swelling at the amputation site again demonstrated. No radiopaque soft tissue foreign bodies or soft tissue gas. As before, there is suggestion of a small erosion at the head of the first metatarsal. No significant change in overall appearance. IMPRESSION: Postoperative amputation of left first toe with soft tissue swelling at the amputation site. Tiny cortical erosion suggested in the distal first metatarsal. Similar appearance to previous study. Electronically Signed   By: Burman Nieves M.D.   On: 11/29/2020 21:24    Procedures Procedures   Medications Ordered in ED Medications - No data to display  ED Course  I have reviewed the triage vital signs and the nursing notes.  Pertinent labs & imaging results that were available  during my care of the patient were reviewed by me and considered in my medical decision making (see chart for details).    MDM Rules/Calculators/A&P                           55 year old male presents to ER requesting wound recheck.  At the beginning of August he underwent left great toe amputation.  Patient was seen in ER a little over a week ago with similar complaint.  Patient was instructed to follow-up with his surgeon and take antibiotic.  Patient did not follow-up with  surgeon but did take antibiotic.  He has slight elevation in his white blood cell count.  However he appears well and has no systemic symptoms.  There is no significant increase in overlying erythema or swelling today compared to a week ago.  X-ray with possible small erosion at head of first metatarsal.  No change in appearance when compared to prior x-ray.  Discussed the case with Dr. Magnus Ivan on-call for Ortho care.  He recommends and stresses need for patient to have close follow-up in the outpatient clinic with Dr. Lajoyce Corners.  If more antibiotics are desired, he recommends switching to doxycycline.  Does not need inpatient admission.  We will provide patient with Rx for Doxy.  Sent Duda message in [place]. Instructed pt to call clinic tomorrow am. Reviewed return precautions and dc home.    After the discussed management above, the patient was determined to be safe for discharge.  The patient was in agreement with this plan and all questions regarding their care were answered.  ED return precautions were discussed and the patient will return to the ED with any significant worsening of condition.  Final Clinical Impression(s) / ED Diagnoses Final diagnoses:  Post-op pain  Postoperative infection, unspecified type, initial encounter  Leukocytosis, unspecified type    Rx / DC Orders ED Discharge Orders          Ordered    doxycycline (VIBRAMYCIN) 100 MG capsule  2 times daily        11/29/20 2228             Milagros Loll, MD 11/29/20 2238

## 2020-11-30 ENCOUNTER — Encounter: Payer: Self-pay | Admitting: Orthopedic Surgery

## 2020-11-30 ENCOUNTER — Ambulatory Visit (INDEPENDENT_AMBULATORY_CARE_PROVIDER_SITE_OTHER): Payer: Self-pay | Admitting: Physician Assistant

## 2020-11-30 ENCOUNTER — Telehealth: Payer: Self-pay | Admitting: Orthopedic Surgery

## 2020-11-30 DIAGNOSIS — M869 Osteomyelitis, unspecified: Secondary | ICD-10-CM

## 2020-11-30 NOTE — Progress Notes (Signed)
Office Visit Note   Patient: Tommy Scott           Date of Birth: 26-Jul-1965           MRN: [id number] Visit Date: 11/30/2020              Requested by: No referring provider defined for this encounter. PCP: Pcp, No  Chief Complaint  Patient presents with   Left Foot - Follow-up    Left great toe amputation 11/11/2020      HPI: Patient is 3 weeks status post great toe amputation.  This is the first follow-up he has had with [place].  He states he has gone to the emergency room twice for them to change the dressing.  He has not changed in between.  He did have a postop shoe but it was way too small so he is wearing a regular shoe today  Assessment & Plan: Visit Diagnoses: No diagnosis found.  Plan: We will provide the patient with a Darco shoe today.  He should be changing the dressing daily and washing with antibacterial soap and water follow-up in 1 week.  Cautioned him not to use any Neosporin or hydrogen peroxide on his foot  Follow-Up Instructions: No follow-ups on file.   Ortho Exam  Patient is alert, oriented, no adenopathy, well-dressed, normal affect, normal respiratory effort. Examination of the wound significantly macerated skin though wound edges are opposed.  Mild erythema but no ascending cellulitis no foul odor some of this macerated skin was debrided today.  No signs of infection  Imaging: DG Foot Complete Left  Result Date: 11/29/2020 CLINICAL DATA:  Left foot infection EXAM: LEFT FOOT - COMPLETE 3+ VIEW COMPARISON:  11/20/2020 FINDINGS: Postoperative changes with previous amputation of the left first toe at the level of the metatarsal phalangeal joint. Soft tissue swelling at the amputation site again demonstrated. No radiopaque soft tissue foreign bodies or soft tissue gas. As before, there is suggestion of a small erosion at the head of the first metatarsal. No significant change in overall appearance. IMPRESSION: Postoperative amputation of left first toe with  soft tissue swelling at the amputation site. Tiny cortical erosion suggested in the distal first metatarsal. Similar appearance to previous study. Electronically Signed   By: Burman Nieves M.D.   On: 11/29/2020 21:24   No images are attached to the encounter.  Labs: Lab Results  Component Value Date   HGBA1C 12.6 (H) 11/11/2020   ESRSEDRATE 37 (H) 11/11/2020   CRP 8.9 (H) 11/11/2020   REPTSTATUS 11/15/2020 FINAL 11/10/2020   REPTSTATUS 11/15/2020 FINAL 11/10/2020   CULT  11/10/2020    NO GROWTH 5 DAYS Performed at [facility], [address], [place], [place] [address]    CULT  11/10/2020    NO GROWTH 5 DAYS Performed at [facility], [address] [place], [place] [address]      No results found for: ALBUMIN, PREALBUMIN, CBC  Lab Results  Component Value Date   MG 2.1 11/12/2020   MG 2.0 11/11/2020   No results found for: VD25OH  No results found for: PREALBUMIN CBC EXTENDED Latest Ref Rng & Units 11/29/2020 11/20/2020 11/12/2020  WBC 4.0 - 10.5 K/uL 13.1(H) 10.9(H) 7.4  RBC 4.22 - 5.81 MIL/uL 4.52 4.71 4.30  HGB 13.0 - 17.0 g/dL 35.3 61.4 12.1(L)  HCT 39.0 - 52.0 % 38.0(L) 40.0 36.9(L)  PLT 150 - 400 K/uL 211 291 221  NEUTROABS 1.7 - 7.7 K/uL 9.1(H)  6.9 4.3  LYMPHSABS 0.7 - 4.0 K/uL 2.6 2.7 1.8     There is no height or weight on file to calculate BMI.  Orders:  No orders of the defined types were placed in this encounter.  No orders of the defined types were placed in this encounter.    Procedures: No procedures performed  Clinical Data: No additional findings.  ROS:  All other systems negative, except as noted in the HPI. Review of Systems  Objective: Vital Signs: There were no vitals taken for this visit.  Specialty Comments:  No specialty comments available.  PMFS History: Patient Active Problem List   Diagnosis Date Noted   Osteomyelitis of great toe of left foot (HCC) 11/10/2020   DMII (diabetes mellitus, type 2) (HCC)  11/10/2020   Past Medical History:  Diagnosis Date   Diabetes mellitus without complication (HCC)     Family History  Problem Relation Age of Onset   Stroke Mother    Diabetes Father     Past Surgical History:  Procedure Laterality Date   AMPUTATION Left 11/11/2020   Procedure: AMPUTATION OF GREAT TOE;  Surgeon: Nadara Mustard, MD;  Location: [facility] OR;  Service: Orthopedics;  Laterality: Left;   LEG SURGERY     Social History   Occupational History   Not on file  Tobacco Use   Smoking status: Never   Smokeless tobacco: Never  Vaping Use   Vaping Use: Never used  Substance and Sexual Activity   Alcohol use: Not Currently   Drug use: Never   Sexual activity: Not on file

## 2020-11-30 NOTE — Telephone Encounter (Signed)
-----   Message from Rodena Medin, [place] sent at 11/30/2020  9:35 AM EDT ----- Regarding: appt I apologize. The Er doctor sent a message to Dr. Lajoyce Corners about the toe amputation on the other patient and it was supposed to be this one. Can you please call to sch? Thank you! Sorry about that

## 2020-11-30 NOTE — Telephone Encounter (Signed)
Called pt and he has an appt for 4 tday

## 2020-12-01 ENCOUNTER — Encounter: Payer: Self-pay | Admitting: Orthopedic Surgery

## 2020-12-03 ENCOUNTER — Ambulatory Visit: Payer: Self-pay

## 2020-12-07 ENCOUNTER — Ambulatory Visit (INDEPENDENT_AMBULATORY_CARE_PROVIDER_SITE_OTHER): Payer: Self-pay | Admitting: Orthopedic Surgery

## 2020-12-07 ENCOUNTER — Encounter: Payer: Self-pay | Admitting: Orthopedic Surgery

## 2020-12-07 ENCOUNTER — Other Ambulatory Visit: Payer: Self-pay | Admitting: Physician Assistant

## 2020-12-07 VITALS — Ht 70.5 in | Wt 190.0 lb

## 2020-12-07 DIAGNOSIS — M869 Osteomyelitis, unspecified: Secondary | ICD-10-CM

## 2020-12-07 NOTE — Progress Notes (Signed)
Office Visit Note   Patient: Tommy Scott           Date of Birth: 03-01-1966           MRN: [id number] Visit Date: 12/07/2020              Requested by: No referring provider defined for this encounter. PCP: Pcp, No  Chief Complaint  Patient presents with   Left Foot - Follow-up    Left great toe amputation 11/11/2020      HPI: Patient is a 55 year old gentleman who is 3 weeks status post left great toe amputation at the MTP joint.  Patient is currently on antibiotics he states he still has drainage.  Assessment & Plan: Visit Diagnoses:  1. Osteomyelitis of great toe of left foot (HCC)     Plan: With the wound dehiscence and exposed bone we will plan for a left foot first ray amputation.  Plan for outpatient surgery discussed the importance of nonweightbearing for 2 weeks to allow the incision to heal.  Follow-Up Instructions: Return in about 1 week (around 12/14/2020).   Ortho Exam  Patient is alert, oriented, no adenopathy, well-dressed, normal affect, normal respiratory effort. Examination patient has a strong dorsalis pedis pulse there is maceration around the amputation incision with clear serosanguineous drainage.  There is an odor.  The ulcer probes to bone with complete dehiscence of the wound.  Patient's hemoglobin A1c in August was 12.6.  Imaging: No results found. No images are attached to the encounter.  Labs: Lab Results  Component Value Date   HGBA1C 12.6 (H) 11/11/2020   ESRSEDRATE 37 (H) 11/11/2020   CRP 8.9 (H) 11/11/2020   REPTSTATUS 11/15/2020 FINAL 11/10/2020   REPTSTATUS 11/15/2020 FINAL 11/10/2020   CULT  11/10/2020    NO GROWTH 5 DAYS Performed at [facility], [address], [place], [place] [address]    CULT  11/10/2020    NO GROWTH 5 DAYS Performed at [facility], [address] [place], [place] [address]      No results found for: ALBUMIN, PREALBUMIN, CBC  Lab Results  Component Value Date   MG 2.1 11/12/2020    MG 2.0 11/11/2020   No results found for: VD25OH  No results found for: PREALBUMIN CBC EXTENDED Latest Ref Rng & Units 11/29/2020 11/20/2020 11/12/2020  WBC 4.0 - 10.5 K/uL 13.1(H) 10.9(H) 7.4  RBC 4.22 - 5.81 MIL/uL 4.52 4.71 4.30  HGB 13.0 - 17.0 g/dL 19.4 17.4 12.1(L)  HCT 39.0 - 52.0 % 38.0(L) 40.0 36.9(L)  PLT 150 - 400 K/uL 211 291 221  NEUTROABS 1.7 - 7.7 K/uL 9.1(H) 6.9 4.3  LYMPHSABS 0.7 - 4.0 K/uL 2.6 2.7 1.8     Body mass index is 26.88 kg/m.  Orders:  No orders of the defined types were placed in this encounter.  No orders of the defined types were placed in this encounter.    Procedures: No procedures performed  Clinical Data: No additional findings.  ROS:  All other systems negative, except as noted in the HPI. Review of Systems  Objective: Vital Signs: Ht 5' 10.5" (1.791 m)   Wt 190 lb (86.2 kg)   BMI 26.88 kg/m   Specialty Comments:  No specialty comments available.  PMFS History: Patient Active Problem List   Diagnosis Date Noted   Osteomyelitis of great toe of left foot (HCC) 11/10/2020   DMII (diabetes mellitus, type 2) (HCC) 11/10/2020   Past Medical History:  Diagnosis  Date   Diabetes mellitus without complication (HCC)     Family History  Problem Relation Age of Onset   Stroke Mother    Diabetes Father     Past Surgical History:  Procedure Laterality Date   AMPUTATION Left 11/11/2020   Procedure: AMPUTATION OF GREAT TOE;  Surgeon: Nadara Mustard, MD;  Location: [facility] OR;  Service: Orthopedics;  Laterality: Left;   LEG SURGERY     Social History   Occupational History   Not on file  Tobacco Use   Smoking status: Never   Smokeless tobacco: Never  Vaping Use   Vaping Use: Never used  Substance and Sexual Activity   Alcohol use: Not Currently   Drug use: Never   Sexual activity: Not on file

## 2020-12-08 ENCOUNTER — Other Ambulatory Visit: Payer: Self-pay

## 2020-12-08 ENCOUNTER — Encounter (HOSPITAL_COMMUNITY): Payer: Self-pay | Admitting: Orthopedic Surgery

## 2020-12-08 ENCOUNTER — Other Ambulatory Visit: Payer: Self-pay | Admitting: Physician Assistant

## 2020-12-08 NOTE — Anesthesia Preprocedure Evaluation (Addendum)
Anesthesia Evaluation  Patient identified by MRN, date of birth, ID band Patient awake    Reviewed: Allergy & Precautions, NPO status , Patient's Chart, lab work & pertinent test results  Airway Mallampati: II  TM Distance: >3 FB Neck ROM: Full    Dental no notable dental hx.    Pulmonary    Pulmonary exam normal breath sounds clear to auscultation       Cardiovascular Exercise Tolerance: Good negative cardio ROS Normal cardiovascular exam Rhythm:Regular Rate:Normal     Neuro/Psych PSYCHIATRIC DISORDERS Depression    GI/Hepatic negative GI ROS, Neg liver ROS,   Endo/Other  diabetes, Poorly Controlled, Type 2  Renal/GU negative Renal ROS     Musculoskeletal  (+) Arthritis ,   Abdominal   Peds  Hematology   Anesthesia Other Findings   Reproductive/Obstetrics negative OB ROS                           Anesthesia Physical Anesthesia Plan  ASA: 4  Anesthesia Plan: MAC   Post-op Pain Management:    Induction: Intravenous  PONV Risk Score and Plan: 1  Airway Management Planned: Natural Airway and Simple Face Mask  Additional Equipment: None  Intra-op Plan:   Post-operative Plan:   Informed Consent: I have reviewed the patients History and Physical, chart, labs and discussed the procedure including the risks, benefits and alternatives for the proposed anesthesia with the patient or authorized representative who has indicated his/her understanding and acceptance.       Plan Discussed with: Anesthesiologist and CRNA  Anesthesia Plan Comments: (BS 407 will give 10 units subQ )     Anesthesia Quick Evaluation

## 2020-12-08 NOTE — Progress Notes (Signed)
Spoke  with pt not to take am dose of Metformin.

## 2020-12-08 NOTE — Progress Notes (Signed)
Anesthesia Chart Review: SAME DAY WORK-UP   Case: [id number] Date/Time: 12/09/20 0930   Procedure: LEFT FOOT 1ST RAY AMPUTATION (Left)   Anesthesia type: Choice   Pre-op diagnosis: Osteomyelitis Left Great Toe   [facility] ROOM 03 / [facility] OR   Surgeons: Nadara Mustard, MD       DISCUSSION: Patient is a 55 year old male scheduled for the above procedure. Admitted 11/10/20-11/12/20 for left great toe infection, uncontrolled DM2 with A1c 12.6%. Resumed metformin and glipizide. S/p left great toe amputation 11/11/20.  Per 12/07/2020 follow-up wound had dehisced with exposed bone.  Dr. Lajoyce Corners has noted patient's last A1c result.  History includes never smoker, DM2, LLE injury (s/p surgery, fasciotomies, has LLE neuropathy), left great toe amputation 11/11/20.  He is a same day work-up. He will get a CBG and anesthesia team evaluation on the day of surgery. EKG as indicated, as > 1 year ago.   VS: BP Readings from Last 3 Encounters:  11/29/20 120/90  11/21/20 (!) 158/84  11/12/20 109/70   Pulse Readings from Last 3 Encounters:  11/29/20 100  11/21/20 90  11/12/20 83     PROVIDERS: Pcp, No   LABS: Currently last lab results include: Lab Results  Component Value Date   WBC 13.1 (H) 11/29/2020   HGB 13.0 11/29/2020   HCT 38.0 (L) 11/29/2020   PLT 211 11/29/2020   GLUCOSE 313 (H) 11/29/2020   NA 133 (L) 11/29/2020   K 4.2 11/29/2020   CL 97 (L) 11/29/2020   CREATININE 0.80 11/29/2020   BUN 27 (H) 11/29/2020   CO2 25 11/29/2020   HGBA1C 12.6 (H) 11/11/2020    EKG: > 1 year ago   CV: N/A  Past Medical History:  Diagnosis Date   Diabetes mellitus without complication [facility])     Past Surgical History:  Procedure Laterality Date   AMPUTATION Left 11/11/2020   Procedure: AMPUTATION OF GREAT TOE;  Surgeon: Nadara Mustard, MD;  Location: [facility] OR;  Service: Orthopedics;  Laterality: Left;   LEG SURGERY      MEDICATIONS: No current facility-administered medications for this encounter.     BAYER BACK & BODY PAIN EX ST 500-32.5 MG TABS   cephALEXin (KEFLEX) 500 MG capsule   doxycycline (VIBRAMYCIN) 100 MG capsule   glipiZIDE (GLUCOTROL) 10 MG tablet   metFORMIN (GLUCOPHAGE) 500 MG tablet    Shonna Chock, PA-C Surgical Short Stay/Anesthesiology [facility] Phone [phone] [facility] Phone [phone] 12/08/2020 1:43 PM

## 2020-12-09 ENCOUNTER — Ambulatory Visit (HOSPITAL_COMMUNITY)
Admission: RE | Admit: 2020-12-09 | Discharge: 2020-12-09 | Disposition: A | Discharge status: Home / Self Care | Payer: Self-pay | Attending: Orthopedic Surgery | Admitting: Orthopedic Surgery

## 2020-12-09 ENCOUNTER — Other Ambulatory Visit: Payer: Self-pay

## 2020-12-09 ENCOUNTER — Encounter (HOSPITAL_COMMUNITY): Payer: Self-pay | Admitting: Orthopedic Surgery

## 2020-12-09 ENCOUNTER — Encounter (HOSPITAL_COMMUNITY)
Admission: RE | Disposition: A | Discharge status: Home / Self Care | Payer: Self-pay | Source: Home / Self Care | Attending: Orthopedic Surgery

## 2020-12-09 ENCOUNTER — Ambulatory Visit (HOSPITAL_COMMUNITY): Payer: Self-pay | Admitting: Physician Assistant

## 2020-12-09 ENCOUNTER — Other Ambulatory Visit: Payer: Self-pay | Admitting: Orthopedic Surgery

## 2020-12-09 ENCOUNTER — Telehealth: Payer: Self-pay

## 2020-12-09 DIAGNOSIS — Z833 Family history of diabetes mellitus: Secondary | ICD-10-CM | POA: Insufficient documentation

## 2020-12-09 DIAGNOSIS — Z792 Long term (current) use of antibiotics: Secondary | ICD-10-CM | POA: Insufficient documentation

## 2020-12-09 DIAGNOSIS — Z7984 Long term (current) use of oral hypoglycemic drugs: Secondary | ICD-10-CM | POA: Insufficient documentation

## 2020-12-09 DIAGNOSIS — Y835 Amputation of limb(s) as the cause of abnormal reaction of the patient, or of later complication, without mention of misadventure at the time of the procedure: Secondary | ICD-10-CM | POA: Insufficient documentation

## 2020-12-09 DIAGNOSIS — Z882 Allergy status to sulfonamides status: Secondary | ICD-10-CM | POA: Insufficient documentation

## 2020-12-09 DIAGNOSIS — T8130XA Disruption of wound, unspecified, initial encounter: Secondary | ICD-10-CM

## 2020-12-09 DIAGNOSIS — L02612 Cutaneous abscess of left foot: Secondary | ICD-10-CM

## 2020-12-09 DIAGNOSIS — T8781 Dehiscence of amputation stump: Secondary | ICD-10-CM | POA: Insufficient documentation

## 2020-12-09 DIAGNOSIS — E1140 Type 2 diabetes mellitus with diabetic neuropathy, unspecified: Secondary | ICD-10-CM | POA: Insufficient documentation

## 2020-12-09 DIAGNOSIS — E1169 Type 2 diabetes mellitus with other specified complication: Secondary | ICD-10-CM | POA: Insufficient documentation

## 2020-12-09 DIAGNOSIS — M869 Osteomyelitis, unspecified: Secondary | ICD-10-CM | POA: Insufficient documentation

## 2020-12-09 DIAGNOSIS — Z89412 Acquired absence of left great toe: Secondary | ICD-10-CM | POA: Insufficient documentation

## 2020-12-09 HISTORY — DX: Unspecified osteoarthritis, unspecified site: M19.90

## 2020-12-09 HISTORY — DX: Myoneural disorder, unspecified: G70.9

## 2020-12-09 HISTORY — DX: Dyspnea, unspecified: R06.00

## 2020-12-09 HISTORY — DX: Depression, unspecified: F32.A

## 2020-12-09 HISTORY — PX: AMPUTATION: SHX166

## 2020-12-09 LAB — GLUCOSE, CAPILLARY
Glucose-Capillary: 362 mg/dL — ABNORMAL HIGH (ref 70–99)
Glucose-Capillary: 407 mg/dL — ABNORMAL HIGH (ref 70–99)
Glucose-Capillary: 353 mg/dL — ABNORMAL HIGH (ref 70–99)

## 2020-12-09 SURGERY — AMPUTATION, FOOT, RAY
Anesthesia: Monitor Anesthesia Care | Laterality: Left

## 2020-12-09 MED ORDER — PROPOFOL 500 MG/50ML IV EMUL
INTRAVENOUS | Status: DC | PRN
  Administered 2020-12-09: 10 mg via INTRAVENOUS
  Administered 2020-12-09: 80 ug/kg/min via INTRAVENOUS

## 2020-12-09 MED ORDER — FENTANYL CITRATE (PF) 250 MCG/5ML IJ SOLN
INTRAMUSCULAR | Status: AC
  Filled 2020-12-09: qty 5

## 2020-12-09 MED ORDER — PROMETHAZINE HCL 25 MG/ML IJ SOLN: 6.2500 mg | INTRAMUSCULAR | Status: DC | PRN

## 2020-12-09 MED ORDER — ONDANSETRON HCL 4 MG/2ML IJ SOLN
INTRAMUSCULAR | Status: DC | PRN
  Administered 2020-12-09: 4 mg via INTRAVENOUS

## 2020-12-09 MED ORDER — CHLORHEXIDINE GLUCONATE 0.12 % MT SOLN: 15.0000 mL | Freq: Once | OROMUCOSAL | Status: AC

## 2020-12-09 MED ORDER — INSULIN ASPART 100 UNIT/ML IJ SOLN
INTRAMUSCULAR | Status: AC
  Administered 2020-12-09: 10 [IU] via SUBCUTANEOUS
  Filled 2020-12-09: qty 1

## 2020-12-09 MED ORDER — PHENYLEPHRINE 40 MCG/ML (10ML) SYRINGE FOR IV PUSH (FOR BLOOD PRESSURE SUPPORT)
PREFILLED_SYRINGE | INTRAVENOUS | Status: DC | PRN
  Administered 2020-12-09 (×2): 160 ug via INTRAVENOUS

## 2020-12-09 MED ORDER — ACETAMINOPHEN 10 MG/ML IV SOLN
INTRAVENOUS | Status: AC
  Filled 2020-12-09: qty 100

## 2020-12-09 MED ORDER — PROPOFOL 10 MG/ML IV BOLUS
INTRAVENOUS | Status: AC
  Filled 2020-12-09: qty 20

## 2020-12-09 MED ORDER — ORAL CARE MOUTH RINSE: 15.0000 mL | Freq: Once | OROMUCOSAL | Status: AC

## 2020-12-09 MED ORDER — OXYCODONE HCL 5 MG/5ML PO SOLN
5.0000 mg | Freq: Once | ORAL | Status: DC | PRN
Start: 2020-12-09 — End: 2020-12-09

## 2020-12-09 MED ORDER — OXYCODONE HCL 5 MG PO TABS
5.0000 mg | ORAL_TABLET | Freq: Once | ORAL | Status: DC | PRN
Start: 2020-12-09 — End: 2020-12-09

## 2020-12-09 MED ORDER — PHENYLEPHRINE HCL-NACL 20-0.9 MG/250ML-% IV SOLN
INTRAVENOUS | Status: AC
  Filled 2020-12-09: qty 250

## 2020-12-09 MED ORDER — FENTANYL CITRATE (PF) 100 MCG/2ML IJ SOLN
INTRAMUSCULAR | Status: DC | PRN
  Administered 2020-12-09: 50 ug via INTRAVENOUS
  Administered 2020-12-09 (×2): 25 ug via INTRAVENOUS

## 2020-12-09 MED ORDER — LACTATED RINGERS IV SOLN: INTRAVENOUS | Status: DC

## 2020-12-09 MED ORDER — LACTATED RINGERS IV SOLN: INTRAVENOUS | Status: DC | PRN

## 2020-12-09 MED ORDER — MIDAZOLAM HCL 5 MG/5ML IJ SOLN
INTRAMUSCULAR | Status: DC | PRN
  Administered 2020-12-09: 2 mg via INTRAVENOUS

## 2020-12-09 MED ORDER — OXYCODONE-ACETAMINOPHEN 5-325 MG PO TABS: 1.0000 | ORAL_TABLET | ORAL | 0 refills | Status: AC | PRN

## 2020-12-09 MED ORDER — PROPOFOL 1000 MG/100ML IV EMUL
INTRAVENOUS | Status: AC
  Filled 2020-12-09: qty 200

## 2020-12-09 MED ORDER — ONDANSETRON HCL 4 MG/2ML IJ SOLN
INTRAMUSCULAR | Status: AC
  Filled 2020-12-09: qty 2

## 2020-12-09 MED ORDER — 0.9 % SODIUM CHLORIDE (POUR BTL) OPTIME
TOPICAL | Status: DC | PRN
  Administered 2020-12-09: 1000 mL

## 2020-12-09 MED ORDER — FENTANYL CITRATE (PF) 100 MCG/2ML IJ SOLN: 25.0000 ug | INTRAMUSCULAR | Status: DC | PRN

## 2020-12-09 MED ORDER — PROPOFOL 500 MG/50ML IV EMUL
INTRAVENOUS | Status: AC
  Filled 2020-12-09: qty 100

## 2020-12-09 MED ORDER — CEFAZOLIN SODIUM-DEXTROSE 2-4 GM/100ML-% IV SOLN
2.0000 g | INTRAVENOUS | Status: AC
  Administered 2020-12-09: 2 g via INTRAVENOUS
  Filled 2020-12-09: qty 100

## 2020-12-09 MED ORDER — PHENYLEPHRINE 40 MCG/ML (10ML) SYRINGE FOR IV PUSH (FOR BLOOD PRESSURE SUPPORT)
PREFILLED_SYRINGE | INTRAVENOUS | Status: AC
  Filled 2020-12-09: qty 10

## 2020-12-09 MED ORDER — INSULIN ASPART 100 UNIT/ML IJ SOLN: 10.0000 [IU] | Freq: Once | INTRAMUSCULAR | Status: AC

## 2020-12-09 MED ORDER — DOXYCYCLINE HYCLATE 100 MG PO TABS: 100.0000 mg | ORAL_TABLET | Freq: Two times a day (BID) | ORAL | 0 refills | Status: AC

## 2020-12-09 MED ORDER — CHLORHEXIDINE GLUCONATE 0.12 % MT SOLN
OROMUCOSAL | Status: AC
  Administered 2020-12-09: 15 mL via OROMUCOSAL
  Filled 2020-12-09: qty 15

## 2020-12-09 MED ORDER — MIDAZOLAM HCL 2 MG/2ML IJ SOLN
INTRAMUSCULAR | Status: AC
  Filled 2020-12-09: qty 2

## 2020-12-09 SURGICAL SUPPLY — 30 items
BAG COUNTER SPONGE SURGICOUNT (BAG) ×2 IMPLANT
BLADE SAW SGTL MED 73X18.5 STR (BLADE) IMPLANT
BLADE SURG 21 STRL SS (BLADE) ×2 IMPLANT
BNDG COHESIVE 3X5 TAN STRL LF (GAUZE/BANDAGES/DRESSINGS) ×2 IMPLANT
BNDG COHESIVE 4X5 TAN STRL (GAUZE/BANDAGES/DRESSINGS) ×2 IMPLANT
BNDG GAUZE ELAST 4 BULKY (GAUZE/BANDAGES/DRESSINGS) ×2 IMPLANT
COVER SURGICAL LIGHT HANDLE (MISCELLANEOUS) ×4 IMPLANT
DRAPE U-SHAPE 47X51 STRL (DRAPES) ×4 IMPLANT
DRSG ADAPTIC 3X8 NADH LF (GAUZE/BANDAGES/DRESSINGS) ×2 IMPLANT
DRSG PAD ABDOMINAL 8X10 ST (GAUZE/BANDAGES/DRESSINGS) ×4 IMPLANT
DURAPREP 26ML APPLICATOR (WOUND CARE) ×2 IMPLANT
ELECT REM PT RETURN 9FT ADLT (ELECTROSURGICAL) ×2
ELECTRODE REM PT RTRN 9FT ADLT (ELECTROSURGICAL) ×1 IMPLANT
GAUZE SPONGE 4X4 12PLY STRL (GAUZE/BANDAGES/DRESSINGS) ×2 IMPLANT
GAUZE SPONGE 4X4 12PLY STRL LF (GAUZE/BANDAGES/DRESSINGS) ×2 IMPLANT
GLOVE SURG ORTHO LTX SZ9 (GLOVE) ×2 IMPLANT
GLOVE SURG UNDER POLY LF SZ9 (GLOVE) ×2 IMPLANT
GOWN STRL REUS W/ TWL XL LVL3 (GOWN DISPOSABLE) ×2 IMPLANT
GOWN STRL REUS W/TWL XL LVL3 (GOWN DISPOSABLE) ×2
KIT BASIN OR (CUSTOM PROCEDURE TRAY) ×2 IMPLANT
KIT TURNOVER KIT B (KITS) ×2 IMPLANT
NS IRRIG 1000ML POUR BTL (IV SOLUTION) ×2 IMPLANT
PACK ORTHO EXTREMITY (CUSTOM PROCEDURE TRAY) ×2 IMPLANT
PAD ABD 8X10 STRL (GAUZE/BANDAGES/DRESSINGS) ×2 IMPLANT
PAD ARMBOARD 7.5X6 YLW CONV (MISCELLANEOUS) ×4 IMPLANT
STOCKINETTE IMPERVIOUS LG (DRAPES) IMPLANT
SUT ETHILON 2 0 PSLX (SUTURE) ×2 IMPLANT
TOWEL GREEN STERILE (TOWEL DISPOSABLE) ×2 IMPLANT
TUBE CONNECTING 12X1/4 (SUCTIONS) ×2 IMPLANT
YANKAUER SUCT BULB TIP NO VENT (SUCTIONS) ×2 IMPLANT

## 2020-12-09 NOTE — Transfer of Care (Signed)
Immediate Anesthesia Transfer of Care Note  Patient: Tommy Scott  Procedure(s) Performed: LEFT FOOT 1ST RAY AMPUTATION (Left)  Patient Location: PACU  Anesthesia Type:MAC  Level of Consciousness: drowsy, patient cooperative and responds to stimulation  Airway & Oxygen Therapy: Patient Spontanous Breathing and Patient connected to nasal cannula oxygen  Post-op Assessment: Report given to RN and Post -op Vital signs reviewed and stable  Post vital signs: Reviewed and stable  Last Vitals:  Vitals Value Taken Time  BP 107/69 12/09/20 0804  Temp    Pulse 80 12/09/20 0805  Resp 13 12/09/20 0805  SpO2 100 % 12/09/20 0805  Vitals shown include unvalidated device data.  Last Pain:  Vitals:   12/09/20 0636  TempSrc: Oral         Complications: No notable events documented.

## 2020-12-09 NOTE — Progress Notes (Signed)
Orthopedic Tech Progress Note Patient Details:  Tommy Scott 04-19-1965 [id number]  PACU RN called for a pair of CRUTCHES    Ortho Devices Type of Ortho Device: Crutches Ortho Device/Splint Interventions: Ordered   Post Interventions Patient Tolerated: Well, Ambulated well Instructions Provided: Poper ambulation with device, Care of device  Donald Pore 12/09/2020, 9:08 AM

## 2020-12-09 NOTE — Telephone Encounter (Signed)
LMOM for patient of the below message from [place] Also let him know we could set him an appt for [place] and wellness

## 2020-12-09 NOTE — Interval H&P Note (Signed)
History and Physical Interval Note:  12/09/2020 6:55 AM  Tommy Scott  has presented today for surgery, with the diagnosis of Osteomyelitis Left Great Toe.  The various methods of treatment have been discussed with the patient and family. After consideration of risks, benefits and other options for treatment, the patient has consented to  Procedure(s): LEFT FOOT 1ST RAY AMPUTATION (Left) as a surgical intervention.  The patient's history has been reviewed, patient examined, no change in status, stable for surgery.  I have reviewed the patient's chart and labs.  Questions were answered to the patient's satisfaction.     Nadara Mustard

## 2020-12-09 NOTE — H&P (Signed)
Tommy Scott is an 55 y.o. male.   Chief Complaint: left foot wound dehiscence HPI: Patient is a 55 year old gentleman who is 3 weeks status post left great toe amputation at the MTP joint.  Patient is currently on antibiotics he states he still has drainage.  Past Medical History:  Diagnosis Date   Arthritis    Depression    Diabetes mellitus without complication (HCC)    Dyspnea    Neuromuscular disorder [facility])     Past Surgical History:  Procedure Laterality Date   AMPUTATION Left 11/11/2020   Procedure: AMPUTATION OF GREAT TOE;  Surgeon: Nadara Mustard, MD;  Location: [facility] OR;  Service: Orthopedics;  Laterality: Left;   LEG SURGERY      Family History  Problem Relation Age of Onset   Stroke Mother    Diabetes Father    Social History:  reports that he has never smoked. He has never used smokeless tobacco. He reports that he does not currently use alcohol. He reports that he does not use drugs.  Allergies:  Allergies  Allergen Reactions   Sulfa Antibiotics Other (See Comments)    Pt states that the medication caused chills, body aches & caused him to pee blood    Medications Prior to Admission  Medication Sig Dispense Refill   BAYER BACK & BODY PAIN EX ST 500-32.5 MG TABS Take 1 tablet by mouth every 6 (six) hours as needed (for pain).     cephALEXin (KEFLEX) 500 MG capsule Take 1 capsule (500 mg total) by mouth 4 (four) times daily. 20 capsule 0   metFORMIN (GLUCOPHAGE) 500 MG tablet Take 1 tablet (500 mg total) by mouth 2 (two) times daily with a meal. 60 tablet 11   doxycycline (VIBRAMYCIN) 100 MG capsule Take 1 capsule (100 mg total) by mouth 2 (two) times daily. (Patient not taking: No sig reported) 20 capsule 0    No results found for this or any previous visit (from the past 48 hour(s)). No results found.  Review of Systems  All other systems reviewed and are negative.  There were no vitals taken for this visit. Physical Exam  Patient is alert, oriented, no  adenopathy, well-dressed, normal affect, normal respiratory effort. Examination patient has a strong dorsalis pedis pulse there is maceration around the amputation incision with clear serosanguineous drainage.  There is an odor.  The ulcer probes to bone with complete dehiscence of the wound.  Patient's hemoglobin A1c in August was 12.6.  Heart RRR Lungs Clear Assessment/Plan 1. Osteomyelitis of great toe of left foot (HCC)       Plan: With the wound dehiscence and exposed bone we will plan for a left foot first ray amputation.  Plan for outpatient surgery discussed the importance of nonweightbearing for 2 weeks to allow the incision to heal.  [place] Oceane Fosse, PA 56/31/2022, 5:50 AM

## 2020-12-09 NOTE — Telephone Encounter (Signed)
Pt's wife Called stating that is discharge summary states that he should stop his antibiotic. She wanted to know if that was correct ?  Pt's wife also asked if they needed to change the bandage before the 1 week follow up?  Pt has no PCP and the free clinic in [place] is full.  They stated that they needed to call the doctor back to see if we can refer him somewhere.  His metformin isn't working it isn't bringing down his blood sugar. Pt's wife stated he was on glipizide and that really helped

## 2020-12-09 NOTE — Op Note (Signed)
12/09/2020  8:05 AM  PATIENT:  Tommy Scott    PRE-OPERATIVE DIAGNOSIS:  Osteomyelitis Left first metatarsal status post great toe amputation  POST-OPERATIVE DIAGNOSIS:  Same  PROCEDURE:  LEFT FOOT 1ST RAY AMPUTATION Local tissue rearrangement for wound closure 9 x 5 cm.  SURGEON:  Nadara Mustard, MD  PHYSICIAN ASSISTANT:None ANESTHESIA:   General  PREOPERATIVE INDICATIONS:  [name] is a  55 y.o. male with a diagnosis of Osteomyelitis Left Great Toe who failed conservative measures and elected for surgical management.    The risks benefits and alternatives were discussed with the patient preoperatively including but not limited to the risks of infection, bleeding, nerve injury, cardiopulmonary complications, the need for revision surgery, among others, and the patient was willing to proceed.  OPERATIVE IMPLANTS: None  @ENCIMAGES @  OPERATIVE FINDINGS: Good petechial bleeding wound margins were clear.  OPERATIVE PROCEDURE: Patient was brought the operating room after undergoing a regional anesthetic the left lower extremity was prepped using DuraPrep draped into a sterile field a timeout was called.  A racquet incision was made around the ulcerative tissue from the great toe amputation.  This was carried down to bone and the first metatarsal was amputated through the base of the shaft.  Electrocardio was used hemostasis the wound margins were clear there was good petechial bleeding the wound was irrigated with normal saline.  Local tissue rearrangement was used to close the wound 9 x 5 cm.  Wound closed with 2-0 nylon.  A sterile dressing was applied patient was taken the PACU in stable condition   DISCHARGE PLANNING:  Antibiotic duration: Preoperative antibiotics  Weightbearing: Touchdown weightbearing on the left  Pain medication: Prescription for Percocet  Dressing care/ Wound VAC: Dry dressing  Ambulatory devices: Crutches  Discharge to: Home.  Follow-up: In the  office 1 week post operative.

## 2020-12-09 NOTE — Progress Notes (Signed)
CBG this AM 407. Dr. Melba Coon was notified. No orders at this time (per Dr. Maple Hudson day team will decide the treatment).

## 2020-12-09 NOTE — Progress Notes (Signed)
After 10 units Novolog s.c. CBG is 362.

## 2020-12-09 NOTE — Anesthesia Postprocedure Evaluation (Signed)
Anesthesia Post Note  Patient: Tommy Scott  Procedure(s) Performed: LEFT FOOT 1ST RAY AMPUTATION (Left)     Patient location during evaluation: PACU Anesthesia Type: MAC Level of consciousness: awake and alert Pain management: pain level controlled Vital Signs Assessment: post-procedure vital signs reviewed and stable Respiratory status: spontaneous breathing and respiratory function stable Cardiovascular status: stable Postop Assessment: no apparent nausea or vomiting Anesthetic complications: no   No notable events documented.  Last Vitals:  Vitals:   12/09/20 0820 12/09/20 0835  BP: 120/73 140/81  Pulse: 83 85  Resp: 14 20  Temp:  36.5 C  SpO2: 98% 99%    Last Pain:  Vitals:   12/09/20 0835  TempSrc:   PainSc: 0-No pain                 Merlinda Frederick

## 2020-12-10 ENCOUNTER — Encounter (HOSPITAL_COMMUNITY): Payer: Self-pay | Admitting: Orthopedic Surgery

## 2020-12-11 ENCOUNTER — Ambulatory Visit: Payer: Self-pay | Admitting: Physical Therapy

## 2020-12-16 ENCOUNTER — Encounter: Payer: Self-pay | Admitting: Family

## 2020-12-16 ENCOUNTER — Ambulatory Visit (INDEPENDENT_AMBULATORY_CARE_PROVIDER_SITE_OTHER): Payer: Self-pay | Admitting: Family

## 2020-12-16 DIAGNOSIS — E11610 Type 2 diabetes mellitus with diabetic neuropathic arthropathy: Secondary | ICD-10-CM

## 2020-12-16 NOTE — Progress Notes (Signed)
   Post-Op Visit Note   Patient: Tommy Scott           Date of Birth: 14-Sep-1965           MRN: [id number] Visit Date: 12/16/2020 PCP: Pcp, No  Chief Complaint: No chief complaint on file.   HPI:  HPI The patient is a 55 year old gentleman seen 1 week status post right first ray amputation.  He did complete oral antibiotics he has completed the course  He and his significant other are quite concerned about his diabetes.  State while he was in the hospital his blood sugar was running in the 400s and he was getting subcu insulin.  At home he is using metformin but feels this does not adequately control his blood sugar.  He is concerned about a 15 pound weight loss in the last 4 weeks. Ortho Exam On examination of the right first ray amputation the incision is approximately sutures there is mild edema very mild surrounding erythema no warmth no drainage no cellulitis  Visit Diagnoses: No diagnosis found.  Plan: Begin daily Dial soap cleansing.  Dry dressing changes.  Minimize weightbearing.  We will send a urgent referral for primary care services for diabetes management.  Recommended he continue with his metformin at this time  Follow-Up Instructions: Return in about 2 weeks (around 12/30/2020).   Imaging: No results found.  Orders:  No orders of the defined types were placed in this encounter.  No orders of the defined types were placed in this encounter.    PMFS History: Patient Active Problem List   Diagnosis Date Noted   Wound dehiscence    Cutaneous abscess of left foot    Osteomyelitis of great toe of left foot (HCC) 11/10/2020   DMII (diabetes mellitus, type 2) (HCC) 11/10/2020   Past Medical History:  Diagnosis Date   Arthritis    Depression    Diabetes mellitus without complication (HCC)    Dyspnea    Neuromuscular disorder (HCC)     Family History  Problem Relation Age of Onset   Stroke Mother    Diabetes Father     Past Surgical History:   Procedure Laterality Date   AMPUTATION Left 11/11/2020   Procedure: AMPUTATION OF GREAT TOE;  Surgeon: Nadara Mustard, MD;  Location: [facility] OR;  Service: Orthopedics;  Laterality: Left;   AMPUTATION Left 12/09/2020   Procedure: LEFT FOOT 1ST RAY AMPUTATION;  Surgeon: Nadara Mustard, MD;  Location: [facility] OR;  Service: Orthopedics;  Laterality: Left;   LEG SURGERY     Social History   Occupational History   Not on file  Tobacco Use   Smoking status: Never   Smokeless tobacco: Never  Vaping Use   Vaping Use: Never used  Substance and Sexual Activity   Alcohol use: Not Currently   Drug use: Never   Sexual activity: Not on file

## 2020-12-29 ENCOUNTER — Ambulatory Visit (INDEPENDENT_AMBULATORY_CARE_PROVIDER_SITE_OTHER): Payer: Self-pay | Admitting: Family

## 2020-12-29 ENCOUNTER — Encounter: Payer: Self-pay | Admitting: Family

## 2020-12-29 DIAGNOSIS — M869 Osteomyelitis, unspecified: Secondary | ICD-10-CM

## 2020-12-29 NOTE — Progress Notes (Signed)
   Post-Op Visit Note   Patient: Tommy Scott           Date of Birth: 06/07/1965           MRN: [id number] Visit Date: 12/29/2020 PCP: Pcp, No  Chief Complaint:  Chief Complaint  Patient presents with   Left Foot - Follow-up    HPI:  HPI Mr. Chaves is a 55 year old gentleman seen status post left first ray amputation on August 31.  He has been cleansing daily and doing dry dressing changes he has been in a Darco shoe.  Ortho Exam Incision is well-healed sutures harvested today.  No erythema maceration no edema.  Visit Diagnoses: No diagnosis found.  Plan: May advance weightbearing in a regular shoe he will follow-up once more in 2 weeks continue dry dressings over the distal end of the incision  Follow-Up Instructions: No follow-ups on file.   Imaging: No results found.  Orders:  No orders of the defined types were placed in this encounter.  No orders of the defined types were placed in this encounter.    PMFS History: Patient Active Problem List   Diagnosis Date Noted   Wound dehiscence    Cutaneous abscess of left foot    Osteomyelitis of great toe of left foot (HCC) 11/10/2020   DMII (diabetes mellitus, type 2) (HCC) 11/10/2020   Past Medical History:  Diagnosis Date   Arthritis    Depression    Diabetes mellitus without complication (HCC)    Dyspnea    Neuromuscular disorder (HCC)     Family History  Problem Relation Age of Onset   Stroke Mother    Diabetes Father     Past Surgical History:  Procedure Laterality Date   AMPUTATION Left 11/11/2020   Procedure: AMPUTATION OF GREAT TOE;  Surgeon: Nadara Mustard, MD;  Location: [facility] OR;  Service: Orthopedics;  Laterality: Left;   AMPUTATION Left 12/09/2020   Procedure: LEFT FOOT 1ST RAY AMPUTATION;  Surgeon: Nadara Mustard, MD;  Location: [facility] OR;  Service: Orthopedics;  Laterality: Left;   LEG SURGERY     Social History   Occupational History   Not on file  Tobacco Use   Smoking status: Never    Smokeless tobacco: Never  Vaping Use   Vaping Use: Never used  Substance and Sexual Activity   Alcohol use: Not Currently   Drug use: Never   Sexual activity: Not on file

## 2021-01-12 ENCOUNTER — Ambulatory Visit (INDEPENDENT_AMBULATORY_CARE_PROVIDER_SITE_OTHER): Payer: Self-pay | Admitting: Family

## 2021-01-12 ENCOUNTER — Encounter: Payer: Self-pay | Admitting: Family

## 2021-01-12 DIAGNOSIS — M869 Osteomyelitis, unspecified: Secondary | ICD-10-CM

## 2021-01-25 ENCOUNTER — Encounter: Payer: Self-pay | Admitting: Orthopedic Surgery

## 2021-01-25 ENCOUNTER — Ambulatory Visit (INDEPENDENT_AMBULATORY_CARE_PROVIDER_SITE_OTHER): Payer: Self-pay | Admitting: Orthopedic Surgery

## 2021-01-25 DIAGNOSIS — Z89412 Acquired absence of left great toe: Secondary | ICD-10-CM

## 2021-01-25 NOTE — Progress Notes (Signed)
Office Visit Note   Patient: Tommy Scott           Date of Birth: 04/26/1965           MRN: [id number] Visit Date: 01/25/2021              Requested by: No referring provider defined for this encounter. PCP: Pcp, No  Chief Complaint  Patient presents with   Left Foot - Routine Post Op    12/09/20 left foot 1st ray amputation       HPI: Patient is a 55 year old gentleman who presents status post left foot first ray amputation about 2 months out he is currently full weightbearing of regular shoes and a cane.  Patient is concerned that his foot looks deformed.  Assessment & Plan: Visit Diagnoses: No diagnosis found.  Plan: Recommended the importance of minimizing weightbearing and maximizing elevation, and use of compression to allow for complete healing.  Follow-Up Instructions: Return in about 2 weeks (around 02/08/2021).   Ortho Exam  Patient is alert, oriented, no adenopathy, well-dressed, normal affect, normal respiratory effort. Examination the wound over the surgical incision is 5 mm in diameter 2 mm deep there is no drainage no swelling there is good granulation tissue.  Patient is currently ambulating full weightbearing with a cane.  Imaging: No results found. No images are attached to the encounter.  Labs: Lab Results  Component Value Date   HGBA1C 12.6 (H) 11/11/2020   ESRSEDRATE 37 (H) 11/11/2020   CRP 8.9 (H) 11/11/2020   REPTSTATUS 11/15/2020 FINAL 11/10/2020   REPTSTATUS 11/15/2020 FINAL 11/10/2020   CULT  11/10/2020    NO GROWTH 5 DAYS Performed at [facility], [address], [place], [place] [address]    CULT  11/10/2020    NO GROWTH 5 DAYS Performed at [facility], [address], [place], [place] [address]      No results found for: ALBUMIN, PREALBUMIN, CBC  Lab Results  Component Value Date   MG 2.1 11/12/2020   MG 2.0 11/11/2020   No results found for: VD25OH  No results found for: PREALBUMIN CBC EXTENDED Latest Ref  Rng & Units 11/29/2020 11/20/2020 11/12/2020  WBC 4.0 - 10.5 K/uL 13.1(H) 10.9(H) 7.4  RBC 4.22 - 5.81 MIL/uL 4.52 4.71 4.30  HGB 13.0 - 17.0 g/dL 53.6 14.4 12.1(L)  HCT 39.0 - 52.0 % 38.0(L) 40.0 36.9(L)  PLT 150 - 400 K/uL 211 291 221  NEUTROABS 1.7 - 7.7 K/uL 9.1(H) 6.9 4.3  LYMPHSABS 0.7 - 4.0 K/uL 2.6 2.7 1.8     There is no height or weight on file to calculate BMI.  Orders:  No orders of the defined types were placed in this encounter.  No orders of the defined types were placed in this encounter.    Procedures: No procedures performed  Clinical Data: No additional findings.  ROS:  All other systems negative, except as noted in the HPI. Review of Systems  Objective: Vital Signs: There were no vitals taken for this visit.  Specialty Comments:  No specialty comments available.  PMFS History: Patient Active Problem List   Diagnosis Date Noted   Wound dehiscence    Cutaneous abscess of left foot    Osteomyelitis of great toe of left foot (HCC) 11/10/2020   DMII (diabetes mellitus, type 2) (HCC) 11/10/2020   Past Medical History:  Diagnosis Date   Arthritis    Depression    Diabetes mellitus without complication (HCC)  Dyspnea    Neuromuscular disorder (HCC)     Family History  Problem Relation Age of Onset   Stroke Mother    Diabetes Father     Past Surgical History:  Procedure Laterality Date   AMPUTATION Left 11/11/2020   Procedure: AMPUTATION OF GREAT TOE;  Surgeon: Nadara Mustard, MD;  Location: [facility] OR;  Service: Orthopedics;  Laterality: Left;   AMPUTATION Left 12/09/2020   Procedure: LEFT FOOT 1ST RAY AMPUTATION;  Surgeon: Nadara Mustard, MD;  Location: [facility] OR;  Service: Orthopedics;  Laterality: Left;   LEG SURGERY     Social History   Occupational History   Not on file  Tobacco Use   Smoking status: Never   Smokeless tobacco: Never  Vaping Use   Vaping Use: Never used  Substance and Sexual Activity   Alcohol use: Not Currently   Drug  use: Never   Sexual activity: Not on file

## 2021-02-03 NOTE — Progress Notes (Signed)
   Post-Op Visit Note   Patient: Tommy Scott           Date of Birth: 1965/06/09           MRN: [id number] Visit Date: 01/12/2021 PCP: Pcp, No  Chief Complaint:  Chief Complaint  Patient presents with   Left Foot - Routine Post Op    12/09/20 left foot 1st ray amputation     HPI:  HPI The patient is a 55 year old gentleman seen status post left foot first ray amputation on August 31.  He is not currently taking an antibiotic.  Unfortunately he does have some redness and swelling as well as drainage and pain that he is worried about.  He is full weightbearing with a cane in regular shoewear Ortho Exam On examination of the left foot incision continues to have one open ulcerative area to distal end. This is 10 mm in diameter and 5 mm deep. Filled in with fibrinous exudative tissue. No drainage. No odor. No erythema.  Visit Diagnoses: No diagnosis found.  Plan: continue daily dial soap cleansing. Dry dressings minimize weight bearing until healed. Discussed return precautions.  Follow-Up Instructions: No follow-ups on file.   Imaging: No results found.  Orders:  No orders of the defined types were placed in this encounter.  No orders of the defined types were placed in this encounter.    PMFS History: Patient Active Problem List   Diagnosis Date Noted   Wound dehiscence    Cutaneous abscess of left foot    Osteomyelitis of great toe of left foot (HCC) 11/10/2020   DMII (diabetes mellitus, type 2) (HCC) 11/10/2020   Past Medical History:  Diagnosis Date   Arthritis    Depression    Diabetes mellitus without complication (HCC)    Dyspnea    Neuromuscular disorder (HCC)     Family History  Problem Relation Age of Onset   Stroke Mother    Diabetes Father     Past Surgical History:  Procedure Laterality Date   AMPUTATION Left 11/11/2020   Procedure: AMPUTATION OF GREAT TOE;  Surgeon: Nadara Mustard, MD;  Location: [facility] OR;  Service: Orthopedics;  Laterality: Left;    AMPUTATION Left 12/09/2020   Procedure: LEFT FOOT 1ST RAY AMPUTATION;  Surgeon: Nadara Mustard, MD;  Location: [facility] OR;  Service: Orthopedics;  Laterality: Left;   LEG SURGERY     Social History   Occupational History   Not on file  Tobacco Use   Smoking status: Never   Smokeless tobacco: Never  Vaping Use   Vaping Use: Never used  Substance and Sexual Activity   Alcohol use: Not Currently   Drug use: Never   Sexual activity: Not on file

## 2021-02-10 ENCOUNTER — Encounter: Payer: Self-pay | Admitting: Family

## 2021-02-10 ENCOUNTER — Ambulatory Visit (INDEPENDENT_AMBULATORY_CARE_PROVIDER_SITE_OTHER): Payer: Self-pay | Admitting: Family

## 2021-02-10 DIAGNOSIS — M869 Osteomyelitis, unspecified: Secondary | ICD-10-CM

## 2021-02-10 NOTE — Progress Notes (Signed)
Office Visit Note   Patient: Tommy Scott           Date of Birth: 11-04-1965           MRN: [id number] Visit Date: 02/10/2021              Requested by: No referring provider defined for this encounter. PCP: Pcp, No  Chief Complaint  Patient presents with   Left Foot - Routine Post Op    1st Ray Amputation 12/09/20      HPI: Patient is a 55 year old gentleman who presents today in postoperative follow-up.  Status post left foot first ray amputation September 31.  This is well-healed he has returned to work.  He is full weightbearing with regular shoewear and a cane  he unfortunately is quite disappointed that he did have amputation of the great toe he feels this is preventing him from moving as quickly and his work is disappointed about months of lost work    Assessment & Plan: Visit Diagnoses: No diagnosis found.  Plan: We will release him to full weightbearing as tolerated in regular shoewear.  He will follow-up on an as-needed basis. Follow-Up Instructions: No follow-ups on file.   Ortho Exam  Patient is alert, oriented, no adenopathy, well-dressed, normal affect, normal respiratory effort. On examination of the left foot the incision is well-healed. There is no drainage no swelling there is good granulation tissue.  Patient is currently ambulating full weightbearing with a cane.  Imaging: No results found. No images are attached to the encounter.  Labs: Lab Results  Component Value Date   HGBA1C 12.6 (H) 11/11/2020   ESRSEDRATE 37 (H) 11/11/2020   CRP 8.9 (H) 11/11/2020   REPTSTATUS 11/15/2020 FINAL 11/10/2020   REPTSTATUS 11/15/2020 FINAL 11/10/2020   CULT  11/10/2020    NO GROWTH 5 DAYS Performed at [facility], [address], [place], [place] [address]    CULT  11/10/2020    NO GROWTH 5 DAYS Performed at [facility], [address] [place], [place] [address]      No results found for: ALBUMIN, PREALBUMIN, CBC  Lab Results   Component Value Date   MG 2.1 11/12/2020   MG 2.0 11/11/2020   No results found for: VD25OH  No results found for: PREALBUMIN CBC EXTENDED Latest Ref Rng & Units 11/29/2020 11/20/2020 11/12/2020  WBC 4.0 - 10.5 K/uL 13.1(H) 10.9(H) 7.4  RBC 4.22 - 5.81 MIL/uL 4.52 4.71 4.30  HGB 13.0 - 17.0 g/dL 38.2 50.5 12.1(L)  HCT 39.0 - 52.0 % 38.0(L) 40.0 36.9(L)  PLT 150 - 400 K/uL 211 291 221  NEUTROABS 1.7 - 7.7 K/uL 9.1(H) 6.9 4.3  LYMPHSABS 0.7 - 4.0 K/uL 2.6 2.7 1.8     There is no height or weight on file to calculate BMI.  Orders:  No orders of the defined types were placed in this encounter.  No orders of the defined types were placed in this encounter.    Procedures: No procedures performed  Clinical Data: No additional findings.  ROS:  All other systems negative, except as noted in the HPI. Review of Systems  Objective: Vital Signs: There were no vitals taken for this visit.  Specialty Comments:  No specialty comments available.  PMFS History: Patient Active Problem List   Diagnosis Date Noted   Wound dehiscence    Cutaneous abscess of left foot    Osteomyelitis of great toe of left foot (HCC) 11/10/2020   DMII (  diabetes mellitus, type 2) ([place]) 11/10/2020   Past Medical History:  Diagnosis Date   Arthritis    Depression    Diabetes mellitus without complication ([place])    Dyspnea    Neuromuscular disorder ([place])     Family History  Problem Relation Age of Onset   Stroke Mother    Diabetes Father     Past Surgical History:  Procedure Laterality Date   AMPUTATION Left 11/11/2020   Procedure: AMPUTATION OF GREAT TOE;  Surgeon: Newt Minion, MD;  Location: [place];  Service: Orthopedics;  Laterality: Left;   AMPUTATION Left 12/09/2020   Procedure: LEFT FOOT 1ST RAY AMPUTATION;  Surgeon: Newt Minion, MD;  Location: [place];  Service: Orthopedics;  Laterality: Left;   LEG SURGERY     Social History   Occupational History   Not on file  Tobacco Use    Smoking status: Never   Smokeless tobacco: Never  Vaping Use   Vaping Use: Never used  Substance and Sexual Activity   Alcohol use: Not Currently   Drug use: Never   Sexual activity: Not on file

## 2021-10-06 IMAGING — DX DG FOOT COMPLETE 3+V*L*
3 series · 3 of 3 positions shown · non-contrast
Comparison: 11/10/2020

CLINICAL DATA: Great toe amputation bleeding sutures

EXAM:
LEFT FOOT - COMPLETE 3+ VIEW

[foot ap]
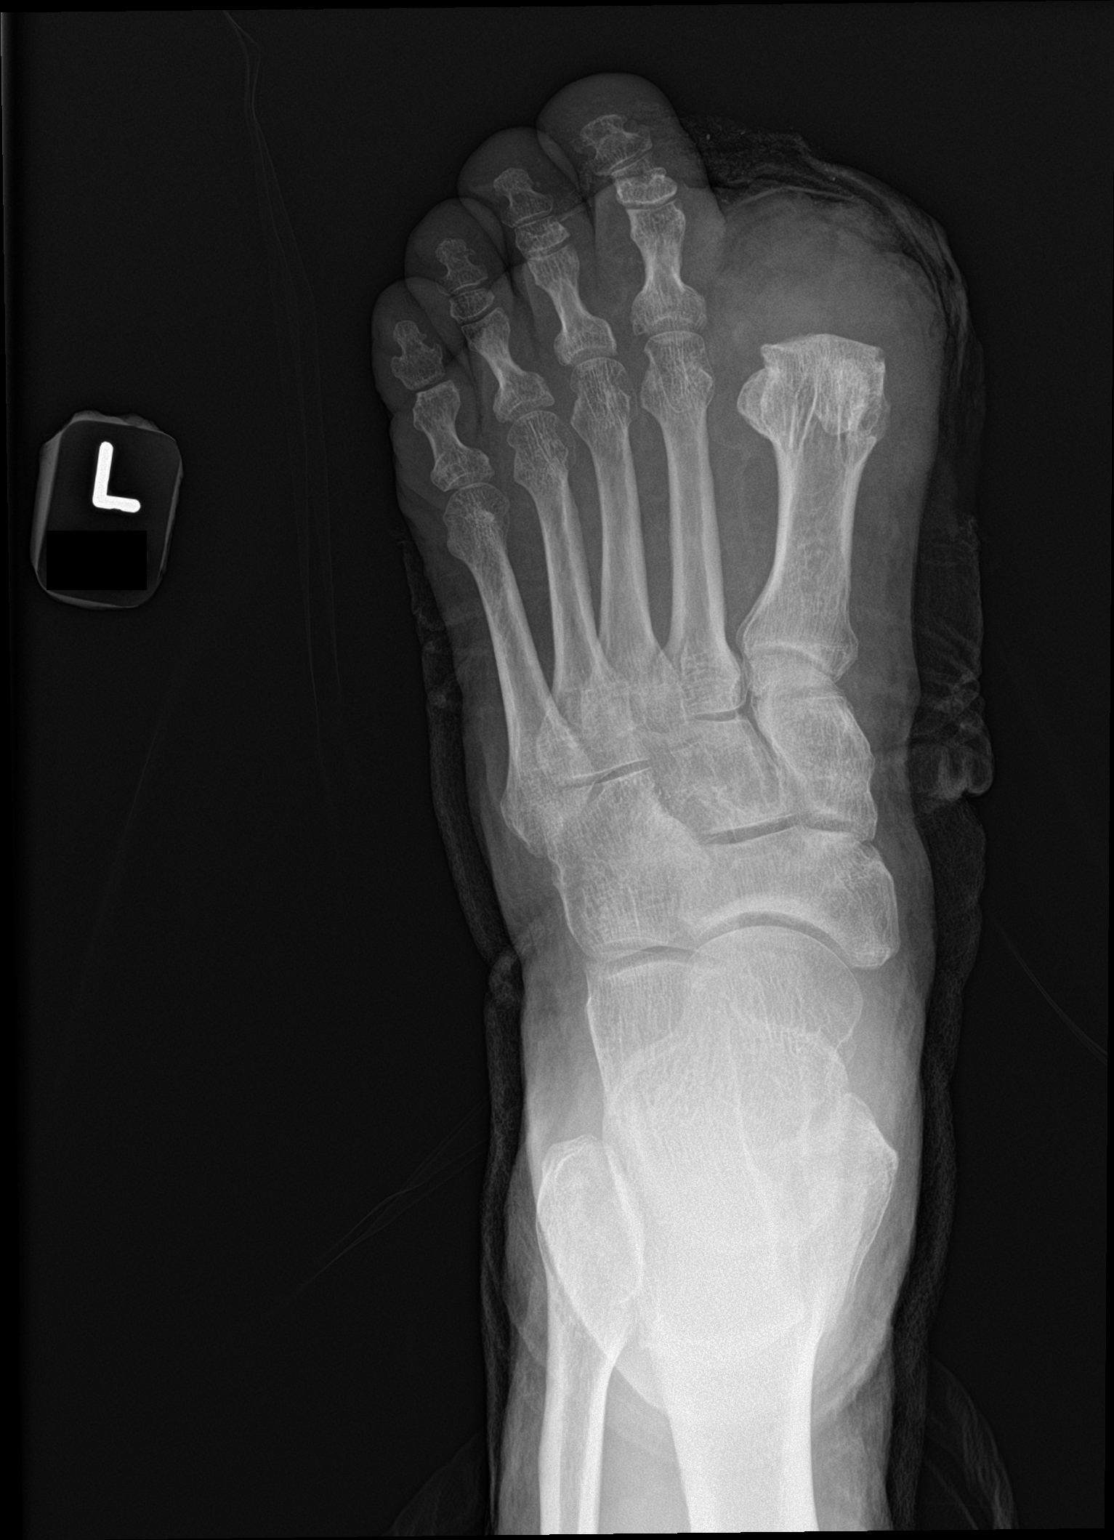

[foot obl]
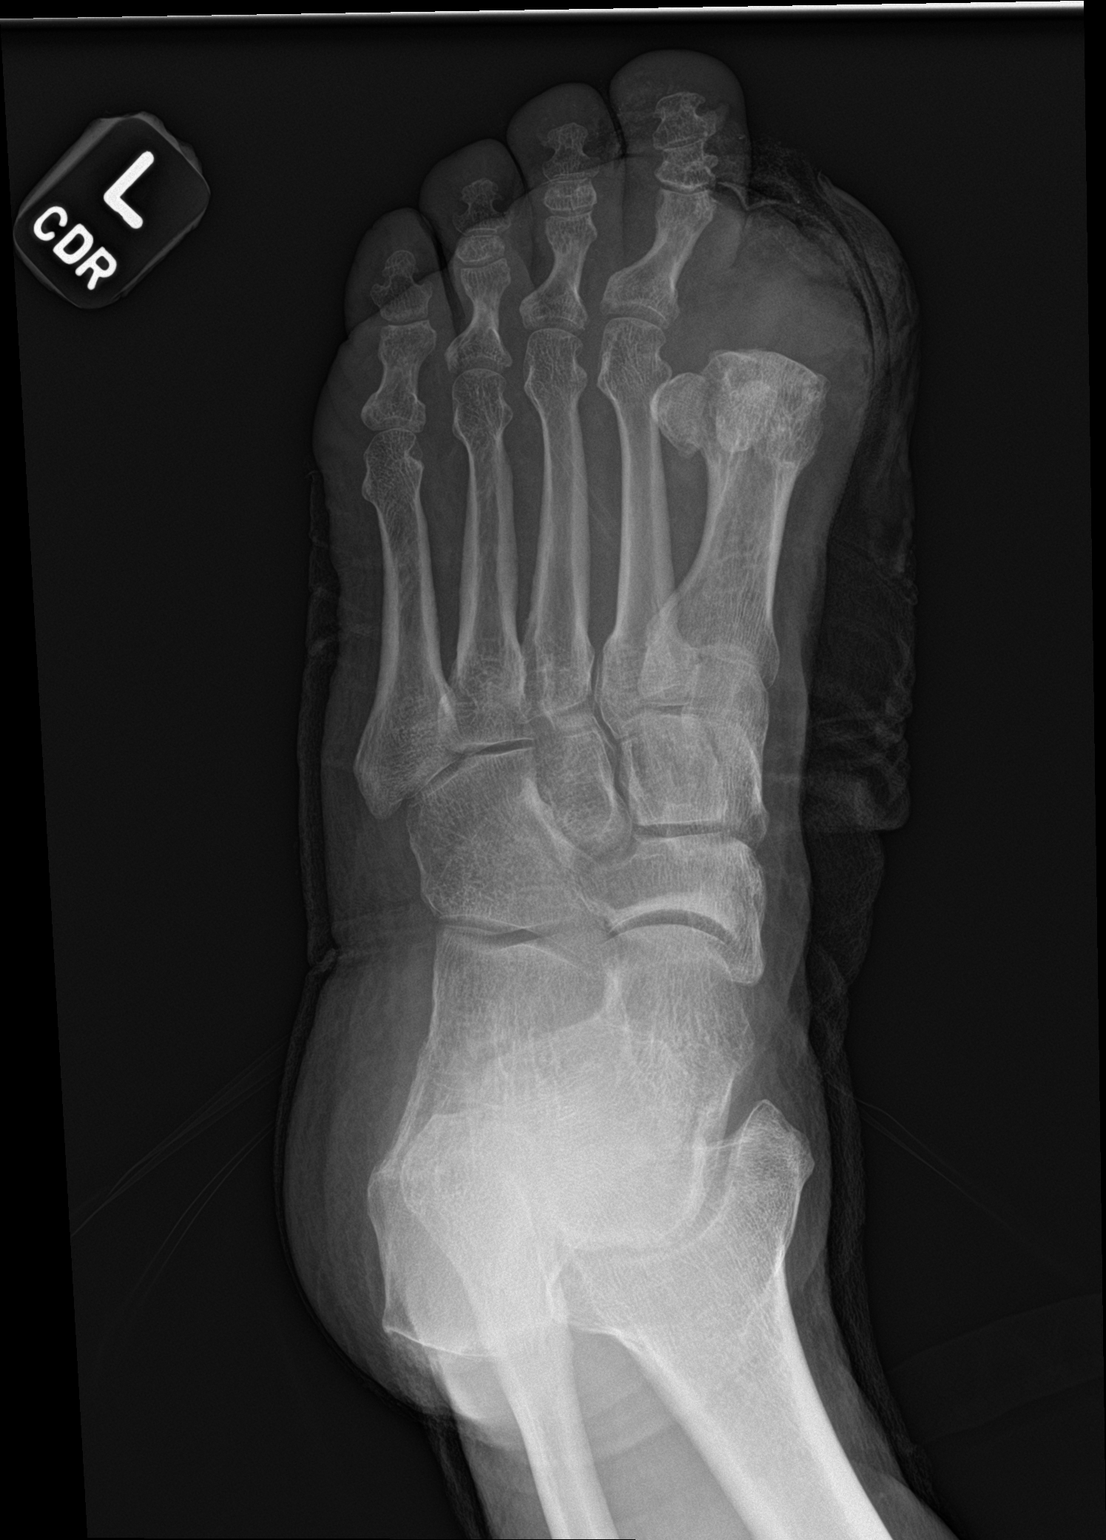

[foot lat]
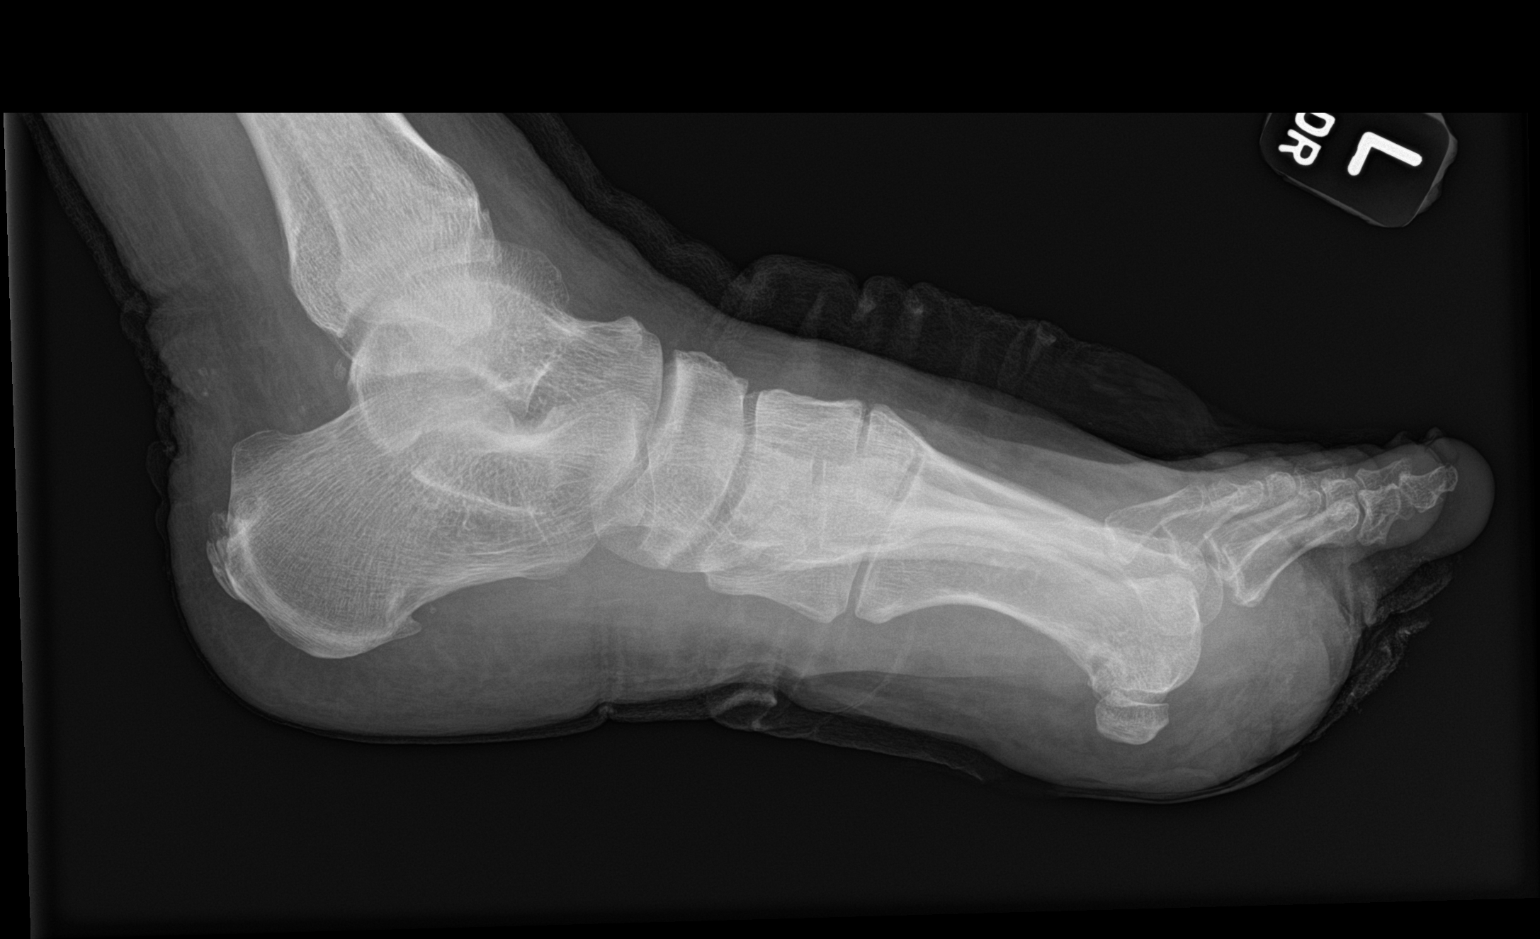

[3 of 3 positions shown; findings below may reference images not displayed]

FINDINGS: Interval amputation at the level of MTP joint. Bulbous soft tissue
thickening at the amputation site. No soft tissue emphysema.
Possible tiny erosion at the head of the first metatarsal.
IMPRESSION: Status post amputation first digit at the level of the MTP joint
with fall with soft tissue swelling at the amputation site. Possible
tiny erosion at the head of the first metatarsal, making it
difficult to exclude early changes of osteomyelitis. Correlation
with MRI should be considered.

## 2023-03-13 DIAGNOSIS — E1140 Type 2 diabetes mellitus with diabetic neuropathy, unspecified: Secondary | ICD-10-CM | POA: Diagnosis not present

## 2023-03-13 DIAGNOSIS — S63641D Sprain of metacarpophalangeal joint of right thumb, subsequent encounter: Secondary | ICD-10-CM | POA: Diagnosis not present

## 2023-03-13 DIAGNOSIS — M62541 Muscle wasting and atrophy, not elsewhere classified, right hand: Secondary | ICD-10-CM | POA: Diagnosis not present

## 2023-04-11 DIAGNOSIS — Z794 Long term (current) use of insulin: Secondary | ICD-10-CM | POA: Diagnosis not present

## 2023-04-11 DIAGNOSIS — L03116 Cellulitis of left lower limb: Secondary | ICD-10-CM | POA: Diagnosis not present

## 2023-04-11 DIAGNOSIS — E11628 Type 2 diabetes mellitus with other skin complications: Secondary | ICD-10-CM | POA: Diagnosis not present

## 2023-04-11 DIAGNOSIS — L089 Local infection of the skin and subcutaneous tissue, unspecified: Secondary | ICD-10-CM | POA: Diagnosis not present

## 2023-04-11 DIAGNOSIS — D72829 Elevated white blood cell count, unspecified: Secondary | ICD-10-CM | POA: Diagnosis not present

## 2023-04-11 DIAGNOSIS — M86472 Chronic osteomyelitis with draining sinus, left ankle and foot: Secondary | ICD-10-CM | POA: Diagnosis not present

## 2023-04-11 DIAGNOSIS — E1165 Type 2 diabetes mellitus with hyperglycemia: Secondary | ICD-10-CM | POA: Diagnosis not present

## 2023-04-11 DIAGNOSIS — E871 Hypo-osmolality and hyponatremia: Secondary | ICD-10-CM | POA: Diagnosis not present

## 2023-04-11 DIAGNOSIS — E1140 Type 2 diabetes mellitus with diabetic neuropathy, unspecified: Secondary | ICD-10-CM | POA: Diagnosis not present

## 2023-04-12 DIAGNOSIS — L089 Local infection of the skin and subcutaneous tissue, unspecified: Secondary | ICD-10-CM | POA: Diagnosis not present

## 2023-04-12 DIAGNOSIS — E11628 Type 2 diabetes mellitus with other skin complications: Secondary | ICD-10-CM | POA: Diagnosis not present

## 2023-04-13 DIAGNOSIS — L089 Local infection of the skin and subcutaneous tissue, unspecified: Secondary | ICD-10-CM | POA: Diagnosis not present

## 2023-04-13 DIAGNOSIS — E11628 Type 2 diabetes mellitus with other skin complications: Secondary | ICD-10-CM | POA: Diagnosis not present

## 2023-04-14 DIAGNOSIS — L089 Local infection of the skin and subcutaneous tissue, unspecified: Secondary | ICD-10-CM | POA: Diagnosis not present

## 2023-04-14 DIAGNOSIS — E11628 Type 2 diabetes mellitus with other skin complications: Secondary | ICD-10-CM | POA: Diagnosis not present

## 2023-04-15 DIAGNOSIS — L089 Local infection of the skin and subcutaneous tissue, unspecified: Secondary | ICD-10-CM | POA: Diagnosis not present

## 2023-04-15 DIAGNOSIS — E11628 Type 2 diabetes mellitus with other skin complications: Secondary | ICD-10-CM | POA: Diagnosis not present

## 2023-04-17 DIAGNOSIS — E11628 Type 2 diabetes mellitus with other skin complications: Secondary | ICD-10-CM | POA: Diagnosis not present

## 2023-04-17 DIAGNOSIS — L089 Local infection of the skin and subcutaneous tissue, unspecified: Secondary | ICD-10-CM | POA: Diagnosis not present

## 2023-05-16 DIAGNOSIS — E1169 Type 2 diabetes mellitus with other specified complication: Secondary | ICD-10-CM | POA: Diagnosis not present

## 2023-05-16 DIAGNOSIS — R7989 Other specified abnormal findings of blood chemistry: Secondary | ICD-10-CM | POA: Diagnosis not present

## 2023-05-16 DIAGNOSIS — E785 Hyperlipidemia, unspecified: Secondary | ICD-10-CM | POA: Diagnosis not present

## 2023-05-16 DIAGNOSIS — Z794 Long term (current) use of insulin: Secondary | ICD-10-CM | POA: Diagnosis not present

## 2023-05-31 DIAGNOSIS — Z794 Long term (current) use of insulin: Secondary | ICD-10-CM | POA: Diagnosis not present

## 2023-05-31 DIAGNOSIS — Z89432 Acquired absence of left foot: Secondary | ICD-10-CM | POA: Diagnosis not present

## 2023-05-31 DIAGNOSIS — R7989 Other specified abnormal findings of blood chemistry: Secondary | ICD-10-CM | POA: Diagnosis not present

## 2023-05-31 DIAGNOSIS — E1165 Type 2 diabetes mellitus with hyperglycemia: Secondary | ICD-10-CM | POA: Diagnosis not present

## 2023-06-06 DIAGNOSIS — Z89432 Acquired absence of left foot: Secondary | ICD-10-CM | POA: Diagnosis not present

## 2023-06-06 DIAGNOSIS — Z794 Long term (current) use of insulin: Secondary | ICD-10-CM | POA: Diagnosis not present

## 2023-06-06 DIAGNOSIS — E1165 Type 2 diabetes mellitus with hyperglycemia: Secondary | ICD-10-CM | POA: Diagnosis not present

## 2023-06-06 DIAGNOSIS — R7989 Other specified abnormal findings of blood chemistry: Secondary | ICD-10-CM | POA: Diagnosis not present

## 2023-06-07 DIAGNOSIS — M869 Osteomyelitis, unspecified: Secondary | ICD-10-CM | POA: Diagnosis not present

## 2023-06-07 DIAGNOSIS — R531 Weakness: Secondary | ICD-10-CM | POA: Diagnosis not present

## 2023-06-07 DIAGNOSIS — E1165 Type 2 diabetes mellitus with hyperglycemia: Secondary | ICD-10-CM | POA: Diagnosis not present

## 2023-06-21 DIAGNOSIS — Z419 Encounter for procedure for purposes other than remedying health state, unspecified: Secondary | ICD-10-CM | POA: Diagnosis not present

## 2023-07-22 DIAGNOSIS — Z419 Encounter for procedure for purposes other than remedying health state, unspecified: Secondary | ICD-10-CM | POA: Diagnosis not present

## 2023-08-21 DIAGNOSIS — Z419 Encounter for procedure for purposes other than remedying health state, unspecified: Secondary | ICD-10-CM | POA: Diagnosis not present

## 2023-09-21 DIAGNOSIS — Z419 Encounter for procedure for purposes other than remedying health state, unspecified: Secondary | ICD-10-CM | POA: Diagnosis not present

## 2023-10-21 DIAGNOSIS — Z419 Encounter for procedure for purposes other than remedying health state, unspecified: Secondary | ICD-10-CM | POA: Diagnosis not present

## 2023-11-07 DIAGNOSIS — G2581 Restless legs syndrome: Secondary | ICD-10-CM | POA: Diagnosis not present

## 2023-11-07 DIAGNOSIS — Z89421 Acquired absence of other right toe(s): Secondary | ICD-10-CM | POA: Diagnosis not present

## 2023-11-07 DIAGNOSIS — E1140 Type 2 diabetes mellitus with diabetic neuropathy, unspecified: Secondary | ICD-10-CM | POA: Diagnosis not present

## 2023-11-07 DIAGNOSIS — Z4789 Encounter for other orthopedic aftercare: Secondary | ICD-10-CM | POA: Diagnosis not present

## 2023-11-07 DIAGNOSIS — E785 Hyperlipidemia, unspecified: Secondary | ICD-10-CM | POA: Diagnosis not present

## 2023-11-07 DIAGNOSIS — E1169 Type 2 diabetes mellitus with other specified complication: Secondary | ICD-10-CM | POA: Diagnosis not present

## 2023-11-07 DIAGNOSIS — Z7689 Persons encountering health services in other specified circumstances: Secondary | ICD-10-CM | POA: Diagnosis not present

## 2023-11-07 DIAGNOSIS — Z4781 Encounter for orthopedic aftercare following surgical amputation: Secondary | ICD-10-CM | POA: Diagnosis not present

## 2023-11-07 DIAGNOSIS — Z89512 Acquired absence of left leg below knee: Secondary | ICD-10-CM | POA: Diagnosis not present

## 2023-11-07 DIAGNOSIS — Z7984 Long term (current) use of oral hypoglycemic drugs: Secondary | ICD-10-CM | POA: Diagnosis not present

## 2023-11-07 DIAGNOSIS — Z794 Long term (current) use of insulin: Secondary | ICD-10-CM | POA: Diagnosis not present

## 2023-11-21 DIAGNOSIS — Z419 Encounter for procedure for purposes other than remedying health state, unspecified: Secondary | ICD-10-CM | POA: Diagnosis not present

## 2023-12-22 DIAGNOSIS — Z419 Encounter for procedure for purposes other than remedying health state, unspecified: Secondary | ICD-10-CM | POA: Diagnosis not present

## 2024-01-21 DIAGNOSIS — Z419 Encounter for procedure for purposes other than remedying health state, unspecified: Secondary | ICD-10-CM | POA: Diagnosis not present
